# Patient Record
Sex: Female | Born: 1975 | Race: Black or African American | Hispanic: No | Marital: Single | Smoking: Current every day smoker
Health system: Southern US, Community
[De-identification: ages and names within clinical notes are randomized; demographics above are authoritative.]

## PROBLEM LIST (undated history)

## (undated) DIAGNOSIS — D649 Anemia, unspecified: Secondary | ICD-10-CM

## (undated) DIAGNOSIS — F32A Depression, unspecified: Secondary | ICD-10-CM

## (undated) DIAGNOSIS — A599 Trichomoniasis, unspecified: Secondary | ICD-10-CM

## (undated) DIAGNOSIS — F419 Anxiety disorder, unspecified: Secondary | ICD-10-CM

## (undated) DIAGNOSIS — D219 Benign neoplasm of connective and other soft tissue, unspecified: Secondary | ICD-10-CM

## (undated) DIAGNOSIS — I10 Essential (primary) hypertension: Secondary | ICD-10-CM

## (undated) DIAGNOSIS — R87619 Unspecified abnormal cytological findings in specimens from cervix uteri: Secondary | ICD-10-CM

## (undated) DIAGNOSIS — F329 Major depressive disorder, single episode, unspecified: Secondary | ICD-10-CM

## (undated) HISTORY — DX: Essential (primary) hypertension: I10

## (undated) HISTORY — DX: Depression, unspecified: F32.A

## (undated) HISTORY — DX: Benign neoplasm of connective and other soft tissue, unspecified: D21.9

## (undated) HISTORY — DX: Unspecified abnormal cytological findings in specimens from cervix uteri: R87.619

## (undated) HISTORY — DX: Anxiety disorder, unspecified: F41.9

## (undated) HISTORY — DX: Anemia, unspecified: D64.9

## (undated) HISTORY — DX: Major depressive disorder, single episode, unspecified: F32.9

## (undated) HISTORY — PX: COLPOSCOPY: SHX161

---
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
## 1997-06-27 ENCOUNTER — Emergency Department (HOSPITAL_COMMUNITY): Admission: EM | Admit: 1997-06-27 | Discharge: 1997-06-27 | Payer: Self-pay | Admitting: Emergency Medicine

## 1997-10-16 ENCOUNTER — Emergency Department (HOSPITAL_COMMUNITY): Admission: EM | Admit: 1997-10-16 | Discharge: 1997-10-16 | Payer: Self-pay | Admitting: Emergency Medicine

## 1997-10-22 ENCOUNTER — Ambulatory Visit (HOSPITAL_COMMUNITY): Admission: RE | Admit: 1997-10-22 | Discharge: 1997-10-22 | Payer: Self-pay

## 1998-10-09 ENCOUNTER — Emergency Department (HOSPITAL_COMMUNITY): Admission: EM | Admit: 1998-10-09 | Discharge: 1998-10-09 | Payer: Self-pay | Admitting: Emergency Medicine

## 1998-10-09 ENCOUNTER — Encounter: Payer: Self-pay | Admitting: Emergency Medicine

## 1999-06-24 ENCOUNTER — Emergency Department (HOSPITAL_COMMUNITY): Admission: EM | Admit: 1999-06-24 | Discharge: 1999-06-25 | Payer: Self-pay | Admitting: Emergency Medicine

## 1999-07-16 ENCOUNTER — Inpatient Hospital Stay (HOSPITAL_COMMUNITY): Admission: AD | Admit: 1999-07-16 | Discharge: 1999-07-16 | Payer: Self-pay | Admitting: *Deleted

## 2001-12-12 ENCOUNTER — Encounter: Payer: Self-pay | Admitting: Emergency Medicine

## 2001-12-12 ENCOUNTER — Emergency Department (HOSPITAL_COMMUNITY): Admission: AC | Admit: 2001-12-12 | Discharge: 2001-12-12 | Payer: Self-pay

## 2002-10-28 ENCOUNTER — Inpatient Hospital Stay (HOSPITAL_COMMUNITY): Admission: AD | Admit: 2002-10-28 | Discharge: 2002-10-28 | Payer: Self-pay | Admitting: Obstetrics & Gynecology

## 2002-10-30 ENCOUNTER — Inpatient Hospital Stay (HOSPITAL_COMMUNITY): Admission: AD | Admit: 2002-10-30 | Discharge: 2002-10-30 | Payer: Self-pay | Admitting: *Deleted

## 2002-11-09 ENCOUNTER — Ambulatory Visit (HOSPITAL_COMMUNITY): Admission: RE | Admit: 2002-11-09 | Discharge: 2002-11-09 | Payer: Self-pay | Admitting: *Deleted

## 2003-02-04 ENCOUNTER — Ambulatory Visit (HOSPITAL_COMMUNITY): Admission: RE | Admit: 2003-02-04 | Discharge: 2003-02-04 | Payer: Self-pay | Admitting: *Deleted

## 2003-06-12 HISTORY — PX: TUBAL LIGATION: SHX77

## 2003-06-25 ENCOUNTER — Inpatient Hospital Stay (HOSPITAL_COMMUNITY): Admission: AD | Admit: 2003-06-25 | Discharge: 2003-06-25 | Payer: Self-pay | Admitting: *Deleted

## 2003-06-27 ENCOUNTER — Inpatient Hospital Stay (HOSPITAL_COMMUNITY): Admission: AD | Admit: 2003-06-27 | Discharge: 2003-06-27 | Payer: Self-pay | Admitting: Obstetrics & Gynecology

## 2003-06-29 ENCOUNTER — Inpatient Hospital Stay (HOSPITAL_COMMUNITY): Admission: AD | Admit: 2003-06-29 | Discharge: 2003-06-29 | Payer: Self-pay | Admitting: Obstetrics and Gynecology

## 2003-07-01 ENCOUNTER — Inpatient Hospital Stay (HOSPITAL_COMMUNITY): Admission: AD | Admit: 2003-07-01 | Discharge: 2003-07-01 | Payer: Self-pay | Admitting: Obstetrics & Gynecology

## 2003-07-08 ENCOUNTER — Inpatient Hospital Stay (HOSPITAL_COMMUNITY): Admission: AD | Admit: 2003-07-08 | Discharge: 2003-07-11 | Payer: Self-pay | Admitting: Obstetrics and Gynecology

## 2003-07-09 ENCOUNTER — Encounter (INDEPENDENT_AMBULATORY_CARE_PROVIDER_SITE_OTHER): Payer: Self-pay | Admitting: Specialist

## 2003-11-21 ENCOUNTER — Emergency Department (HOSPITAL_COMMUNITY): Admission: EM | Admit: 2003-11-21 | Discharge: 2003-11-21 | Payer: Self-pay | Admitting: Family Medicine

## 2006-02-08 ENCOUNTER — Emergency Department (HOSPITAL_COMMUNITY): Admission: EM | Admit: 2006-02-08 | Discharge: 2006-02-08 | Payer: Self-pay | Admitting: Family Medicine

## 2006-09-02 ENCOUNTER — Ambulatory Visit (HOSPITAL_COMMUNITY): Admission: RE | Admit: 2006-09-02 | Discharge: 2006-09-02 | Payer: Self-pay | Admitting: Obstetrics & Gynecology

## 2006-09-27 ENCOUNTER — Emergency Department (HOSPITAL_COMMUNITY): Admission: EM | Admit: 2006-09-27 | Discharge: 2006-09-27 | Payer: Self-pay | Admitting: Emergency Medicine

## 2007-11-10 ENCOUNTER — Other Ambulatory Visit: Admission: RE | Admit: 2007-11-10 | Discharge: 2007-11-10 | Payer: Self-pay | Admitting: Family Medicine

## 2009-04-16 ENCOUNTER — Ambulatory Visit (HOSPITAL_COMMUNITY): Admission: RE | Admit: 2009-04-16 | Discharge: 2009-04-16 | Payer: Self-pay | Admitting: Obstetrics & Gynecology

## 2010-01-31 ENCOUNTER — Encounter: Payer: Self-pay | Admitting: *Deleted

## 2010-03-29 ENCOUNTER — Emergency Department (HOSPITAL_COMMUNITY): Payer: PRIVATE HEALTH INSURANCE

## 2010-03-29 ENCOUNTER — Emergency Department (HOSPITAL_COMMUNITY)
Admission: EM | Admit: 2010-03-29 | Discharge: 2010-03-30 | Disposition: A | Discharge status: Home / Self Care | Payer: PRIVATE HEALTH INSURANCE | Attending: Emergency Medicine | Admitting: Emergency Medicine

## 2010-03-29 DIAGNOSIS — M545 Low back pain, unspecified: Secondary | ICD-10-CM | POA: Insufficient documentation

## 2010-03-29 DIAGNOSIS — N12 Tubulo-interstitial nephritis, not specified as acute or chronic: Secondary | ICD-10-CM | POA: Insufficient documentation

## 2010-03-29 DIAGNOSIS — D259 Leiomyoma of uterus, unspecified: Secondary | ICD-10-CM | POA: Insufficient documentation

## 2010-03-29 DIAGNOSIS — R109 Unspecified abdominal pain: Secondary | ICD-10-CM | POA: Insufficient documentation

## 2010-03-29 LAB — COMPREHENSIVE METABOLIC PANEL
AST: 12 U/L (ref 0–37)
Albumin: 3.3 g/dL — ABNORMAL LOW (ref 3.5–5.2)
Calcium: 8.7 mg/dL (ref 8.4–10.5)
GFR calc Af Amer: >60 mL/min (ref >60)
Glucose, Bld: 82 mg/dL (ref 70–99)
Potassium: 3.2 mEq/L — ABNORMAL LOW (ref 3.5–5.1)
Total Protein: 6.8 g/dL (ref 6.0–8.3)

## 2010-03-29 LAB — CBC
HCT: 29.3 % — ABNORMAL LOW (ref 36.0–46.0)
Hemoglobin: 9.5 g/dL — ABNORMAL LOW (ref 12.0–15.0)
MCH: 27.4 pg (ref 26.0–34.0)
MCHC: 32.4 g/dL (ref 30.0–36.0)
Platelets: 226 10*3/uL (ref 150–400)
RBC: 3.47 MIL/uL — ABNORMAL LOW (ref 3.87–5.11)
RDW: 15.3 % (ref 11.5–15.5)

## 2010-03-29 LAB — DIFFERENTIAL
Basophils Relative: 0 % (ref 0–1)
Eosinophils Absolute: 0.1 10*3/uL (ref 0.0–0.7)
Lymphs Abs: 2.1 10*3/uL (ref 0.7–4.0)
Monocytes Absolute: 0.5 10*3/uL (ref 0.1–1.0)
Neutro Abs: 4.2 10*3/uL (ref 1.7–7.7)
Neutrophils Relative %: 61 % (ref 43–77)

## 2010-03-29 LAB — URINALYSIS, ROUTINE W REFLEX MICROSCOPIC
Nitrite: NEGATIVE
Protein, ur: NEGATIVE mg/dL
Specific Gravity, Urine: 1.014 (ref 1.005–1.030)

## 2010-03-29 LAB — URINE MICROSCOPIC-ADD ON

## 2010-03-29 LAB — LIPASE, BLOOD: Lipase: 26 U/L (ref 11–59)

## 2010-03-30 ENCOUNTER — Encounter (HOSPITAL_COMMUNITY): Payer: Self-pay

## 2010-03-30 MED ORDER — IOHEXOL 300 MG/ML  SOLN
100.0000 mL | Freq: Once | INTRAMUSCULAR | Status: AC | PRN
  Administered 2010-03-30: 100 mL via INTRAVENOUS

## 2010-03-31 LAB — URINE CULTURE
Colony Count: NO GROWTH
Culture: NO GROWTH

## 2010-05-29 NOTE — Op Note (Signed)
Kara Morales, Kara Morales                       ACCOUNT NO.:  [id number]   MEDICAL RECORD NO.:  [id number]                   PATIENT TYPE:  INP   LOCATION:  9146                                 FACILITY:  [facility]   PHYSICIAN:  Conni Elliot, M.D.             DATE OF BIRTH:  Jun 28, 1975   DATE OF PROCEDURE:  07/09/2003  DATE OF DISCHARGE:                                 OPERATIVE REPORT   PREOPERATIVE DIAGNOSIS:  Desires surgical sterilization.   POSTOPERATIVE DIAGNOSIS:  Desires surgical sterilization.   OPERATION:  Modified bilateral Pomeroy tubal ligation.   SURGEON:  Conni Elliot, M.D.   ANESTHESIA:  Continuous local epidural.   FINDINGS:  Uterus, tubes and ovaries normal for post gravid state.   DESCRIPTION OF PROCEDURE:  After presentation of continuous lumbar epidural  anesthetic, the patient in supine, abdomen is prepped and draped in sterile  fashion.  A periumbilical incision is made.  Incision made through the skin  to the fascia.  __________.  Right fallopian tube is identified, grasped  with Babcock clamp, followed to fimbriated end.  Segment of tube is brought  into the operative field, doubly suture ligated and approximately 0.5 to 2  cm segment was incised.  Hemostasis was adequate.  Procedure done on the  opposite side.  Hemostasis again adequate.  Anterior peritoneum and fascia  as well as skin closed in same fashion.  Estimated blood loss less than 10  mL.  Needle and sponge counts correct.                                               Conni Elliot, M.D.    ASG/MEDQ  D:  07/09/2003  T:  07/09/2003  Job:  [id number]

## 2012-03-24 ENCOUNTER — Encounter (HOSPITAL_COMMUNITY): Payer: Self-pay | Admitting: Emergency Medicine

## 2012-03-24 ENCOUNTER — Emergency Department (HOSPITAL_COMMUNITY)
Admission: EM | Admit: 2012-03-24 | Discharge: 2012-03-24 | Disposition: A | Discharge status: Home / Self Care | Payer: PRIVATE HEALTH INSURANCE | Source: Home / Self Care | Attending: Emergency Medicine | Admitting: Emergency Medicine

## 2012-03-24 DIAGNOSIS — I10 Essential (primary) hypertension: Secondary | ICD-10-CM

## 2012-03-24 LAB — POCT I-STAT, CHEM 8
BUN: 8 mg/dL (ref 6–23)
Calcium, Ion: 1.23 mmol/L (ref 1.12–1.23)
Creatinine, Ser: 0.9 mg/dL (ref 0.50–1.10)
HCT: 37 % (ref 36.0–46.0)
Hemoglobin: 12.6 g/dL (ref 12.0–15.0)
Potassium: 3.6 mEq/L (ref 3.5–5.1)
TCO2: 28 mmol/L (ref 0–100)

## 2012-03-24 MED ORDER — HYDROCHLOROTHIAZIDE 25 MG PO TABS: 25.0000 mg | ORAL_TABLET | Freq: Every day | ORAL | Status: DC

## 2012-03-24 NOTE — ED Notes (Signed)
Notified dr Lorenz Coaster of 2 elevated blood pressure readings

## 2012-03-24 NOTE — ED Provider Notes (Signed)
Chief Complaint:   Chief Complaint  Patient presents with  . Hypertension    History of Present Illness:   Kara Morales is a 37 year old female who became concerned about her blood pressure yesterday. 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 Her blood pressure yesterday 144/110 and 158/118. Today her blood pressure was 140/101 and 160/120. Both her mother and her father have high blood pressure. She denies any headaches, dizziness, blurry vision, chest pain, or shortness of breath. She is a cigarette smoker but does not drink alcohol. She is not careful about salt and sodium intake. She denies any history of diabetes, elevated cholesterol, heart disease, stroke, or kidney disease.  Review of Systems:  Other than noted above, the patient denies any of the following symptoms: Systemic:  No fever, chills, fatigue, weight loss or gain. Respiratory:  No coughing, wheezing, or shortness of breath. Cardiac:  No chest pain, tightness, pressure, palpitations, syncope, or edema. Neuro:  No headache, dizziness, blurred vision, weakness, paresthesias, or strokelike symptoms.  PMFSH:  Past medical history, family history, social history, meds, and allergies were reviewed.  Physical Exam:   Vital signs:  BP 158/102  Pulse 75  Temp(Src) 98.7 F (37.1 C)  Resp 20  SpO2 98%  LMP 03/12/2012 General:  Alert, oriented, in no distress. Lungs:  Breath sounds clear and equal bilaterally.  No wheezes, rales, or rhonchi. Heart:  Regular rhythm, no gallops, murmers, clicks or rubs.  Abdomen:  Soft and flat.  Nontender, no organomegaly or mass.  No pulsatile midline abdominal mass or bruit. Ext:  No edema, pulses full.  Labs:   Results for orders placed during the hospital encounter of 03/24/12  POCT I-STAT, CHEM 8      Result Value Range   Sodium 142  135 - 145 mEq/L   Potassium 3.6  3.5 - 5.1 mEq/L   Chloride 106  96 - 112 mEq/L    BUN 8  6 - 23 mg/dL   Creatinine, Ser 4.09  0.50 - 1.10 mg/dL   Glucose, Bld 88  70 - 99 mg/dL   Calcium, Ion 8.11  9.14 - 1.23 mmol/L   TCO2 28  0 - 100 mmol/L   Hemoglobin 12.6  12.0 - 15.0 g/dL   HCT 78.2  95.6 - 21.3 %    Assessment:  The encounter diagnosis was Hypertension.  She has had elevated blood pressure on 3 separate occasions, therefore she needs to be medicated. She was started on hydrochlorothiazide and I suggested that she followup with a primary care physician within the next 2 weeks. Information was given about salt and sodium intake.  Plan:   1.  The following meds were prescribed:   Discharge Medication List as of 03/24/2012  3:49 PM    START taking these medications   Details  hydrochlorothiazide (HYDRODIURIL) 25 MG tablet Take 1 tablet (25 mg total) by mouth daily., Starting 03/24/2012, Until Discontinued, Normal       2.  The patient was instructed in symptomatic care and handouts were given. 3.  The patient was told to return if becoming worse in any way, if no better in 3 or 4 days, and given some red flag symptoms that would indicate earlier return.    Reuben Likes, MD 03/24/12 [phone]

## 2012-03-24 NOTE — ED Notes (Signed)
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 Patient used an automatic wrist cuff-144/110.  Pressure was manually checked -158/118.  Denies running high blood pressures in the past.  Patient has small headache today, but none out of the ordinary per patient

## 2012-08-17 ENCOUNTER — Ambulatory Visit: Payer: Self-pay | Admitting: Obstetrics & Gynecology

## 2013-01-11 DIAGNOSIS — I10 Essential (primary) hypertension: Secondary | ICD-10-CM

## 2013-01-11 HISTORY — DX: Essential (primary) hypertension: I10

## 2013-01-18 ENCOUNTER — Emergency Department (INDEPENDENT_AMBULATORY_CARE_PROVIDER_SITE_OTHER)
Admission: EM | Admit: 2013-01-18 | Discharge: 2013-01-18 | Disposition: A | Discharge status: Home / Self Care | Payer: PRIVATE HEALTH INSURANCE | Source: Home / Self Care | Attending: Family Medicine | Admitting: Family Medicine

## 2013-01-18 ENCOUNTER — Encounter (HOSPITAL_COMMUNITY): Payer: Self-pay | Admitting: Emergency Medicine

## 2013-01-18 DIAGNOSIS — I10 Essential (primary) hypertension: Secondary | ICD-10-CM

## 2013-01-18 LAB — POCT I-STAT, CHEM 8
BUN: 8 mg/dL (ref 6–23)
CALCIUM ION: 1.23 mmol/L (ref 1.12–1.23)
CHLORIDE: 104 meq/L (ref 96–112)
Creatinine, Ser: 0.7 mg/dL (ref 0.50–1.10)
GLUCOSE: 83 mg/dL (ref 70–99)
HEMATOCRIT: 37 % (ref 36.0–46.0)
Hemoglobin: 12.6 g/dL (ref 12.0–15.0)
Potassium: 3.7 mEq/L (ref 3.7–5.3)
SODIUM: 140 meq/L (ref 137–147)
TCO2: 26 mmol/L (ref 0–100)

## 2013-01-18 MED ORDER — LISINOPRIL-HYDROCHLOROTHIAZIDE 10-12.5 MG PO TABS: 1.0000 | ORAL_TABLET | Freq: Every day | ORAL | Status: DC

## 2013-01-18 NOTE — Discharge Instructions (Signed)
Take medicine daily as prescribed, you must see a doctor in 3-4 weeks for recheck and further refills.

## 2013-01-18 NOTE — ED Provider Notes (Signed)
CSN: [id number]     Arrival date & time 01/18/13  1629 History   First MD Initiated Contact with Patient 01/18/13 1849     No chief complaint on file.  (Consider location/radiation/quality/duration/timing/severity/associated sxs/prior Treatment) Patient is a 38 y.o. female presenting with hypertension. The history is provided by the patient.  Hypertension This is a chronic problem. The current episode started 6 to 12 hours ago (seen by dr Jake Michaelis 3/14 and given meds for hbp, took all and did no t refill or no lmd f/u, here today b/o bp found to be elevated.at work.). The problem has not changed since onset.Associated symptoms include headaches. Pertinent negatives include no chest pain and no shortness of breath.    No past medical history on file. Past Surgical History  Procedure Laterality Date  . Tubal ligation     Family History  Problem Relation Age of Onset  . Hypertension Mother   . Hypertension Father   . Diabetes Other    History  Substance Use Topics  . Smoking status: Current Every Day Smoker    Types: Cigars  . Smokeless tobacco: Not on file  . Alcohol Use: Yes   OB History   Grav Para Term Preterm Abortions TAB SAB Ect Mult Living                 Review of Systems  Constitutional: Negative.   Respiratory: Negative for chest tightness and shortness of breath.   Cardiovascular: Negative for chest pain and leg swelling.  Neurological: Positive for headaches. Negative for dizziness, weakness, light-headedness and numbness.    Allergies  Review of patient's allergies indicates no known allergies.  Home Medications   Current Outpatient Rx  Name  Route  Sig  Dispense  Refill  . Biotin 10 MG CAPS   Oral   Take by mouth.         . hydrochlorothiazide (HYDRODIURIL) 25 MG tablet   Oral   Take 1 tablet (25 mg total) by mouth daily.   90 tablet   0   . lisinopril-hydrochlorothiazide (PRINZIDE,ZESTORETIC) 10-12.5 MG per tablet   Oral   Take 1 tablet by mouth  daily.   30 tablet   1   . Multiple Vitamin (MULTIVITAMIN) tablet   Oral   Take 1 tablet by mouth daily.          BP 135/96  Pulse 80  Temp(Src) 98.5 F (36.9 C) (Oral)  Resp 16  SpO2 100% Physical Exam  Nursing note and vitals reviewed. Constitutional: She is oriented to person, place, and time. She appears well-developed and well-nourished. No distress.  Eyes: Conjunctivae and EOM are normal. Pupils are equal, round, and reactive to light.  Neck: Normal range of motion. Neck supple.  Cardiovascular: Regular rhythm, normal heart sounds and intact distal pulses.   Pulmonary/Chest: Breath sounds normal.  Musculoskeletal: She exhibits no edema.  Lymphadenopathy:    She has no cervical adenopathy.  Neurological: She is alert and oriented to person, place, and time.  Skin: Skin is warm and dry.    ED Course  Procedures (including critical care time) Labs Review Labs Reviewed  POCT I-STAT, CHEM 8   Imaging Review No results found.  EKG Interpretation    Date/Time:    Ventricular Rate:    PR Interval:    QRS Duration:   QT Interval:    QTC Calculation:   R Axis:     Text Interpretation:  MDM  i-stat wnl.    Billy Fischer, MD 01/18/13 Curly [place]

## 2013-01-18 NOTE — ED Notes (Signed)
[facility] and not feeling well, bp 140/80 per patient.  Reports at work readings of 152/120??.  Currently does not feel any chest pain, does not feel particularly bad.

## 2013-06-21 ENCOUNTER — Ambulatory Visit: Payer: PRIVATE HEALTH INSURANCE | Admitting: Obstetrics & Gynecology

## 2013-08-09 DIAGNOSIS — I10 Essential (primary) hypertension: Secondary | ICD-10-CM | POA: Insufficient documentation

## 2013-08-21 ENCOUNTER — Encounter: Payer: Self-pay | Admitting: *Deleted

## 2013-09-05 ENCOUNTER — Ambulatory Visit: Payer: PRIVATE HEALTH INSURANCE | Admitting: Obstetrics & Gynecology

## 2013-09-06 ENCOUNTER — Encounter: Payer: Self-pay | Admitting: Obstetrics & Gynecology

## 2013-09-06 ENCOUNTER — Ambulatory Visit (INDEPENDENT_AMBULATORY_CARE_PROVIDER_SITE_OTHER): Payer: PRIVATE HEALTH INSURANCE | Admitting: Obstetrics & Gynecology

## 2013-09-06 VITALS — BP 108/84 | Temp 98.5°F | Ht 67.5 in | Wt 183.0 lb

## 2013-09-06 DIAGNOSIS — Z01419 Encounter for gynecological examination (general) (routine) without abnormal findings: Secondary | ICD-10-CM

## 2013-09-06 DIAGNOSIS — D259 Leiomyoma of uterus, unspecified: Secondary | ICD-10-CM

## 2013-09-06 LAB — CBC
HCT: 34.4 % — ABNORMAL LOW (ref 36.0–46.0)
Hemoglobin: 11.5 g/dL — ABNORMAL LOW (ref 12.0–15.0)
MCH: 27.3 pg (ref 26.0–34.0)
MCHC: 33.4 g/dL (ref 30.0–36.0)
MCV: 81.5 fL (ref 78.0–100.0)
PLATELETS: 271 10*3/uL (ref 150–400)
RBC: 4.22 MIL/uL (ref 3.87–5.11)
RDW: 17.8 % — ABNORMAL HIGH (ref 11.5–15.5)
WBC: 6.2 10*3/uL (ref 4.0–10.5)

## 2013-09-06 NOTE — Progress Notes (Signed)
Subjective:     Kara Morales is a 38 y.o. female here for a routine exam.  Current complaints: none.    Personal health questionnaire:  Is patient Ashkenazi Jewish, have a family history of breast and/or ovarian cancer: no Is there a family history of uterine cancer diagnosed at age < 33, gastrointestinal cancer, urinary tract cancer, family member who is a Field seismologist syndrome-associated carrier: no Is the patient overweight and hypertensive, family history of diabetes, personal history of gestational diabetes or PCOS: yes Is patient over 8, have PCOS,  family history of premature CHD under age 38, diabetes, smoke, have hypertension or peripheral artery disease:  yes At any time, has a partner hit, kicked or otherwise hurt or frightened you?: no Over the past 2 weeks, have you felt down, depressed or hopeless?: no Over the past 2 weeks, have you felt little interest or pleasure in doing things?:no   Gynecologic History Patient's last menstrual period was 08/13/2013. Contraception: tubal ligation Last Pap: 2011. Results were: normal   Obstetric History OB History  Gravida Para Term Preterm AB SAB TAB Ectopic Multiple Living  0 0 0 0 0 0 0 0 0 0         History reviewed. No pertinent past medical history.  Past Surgical History  Procedure Laterality Date  . Tubal ligation      Current outpatient prescriptions:azithromycin (ZITHROMAX) 250 MG tablet, Take 250 mg by mouth daily., Disp: , Rfl: ;  Biotin 10 MG CAPS, Take by mouth., Disp: , Rfl: ;  lisinopril-hydrochlorothiazide (PRINZIDE,ZESTORETIC) 10-12.5 MG per tablet, Take 1 tablet by mouth daily., Disp: 30 tablet, Rfl: 1;  Multiple Vitamin (MULTIVITAMIN) tablet, Take 1 tablet by mouth daily., Disp: , Rfl:  No Known Allergies  History  Substance Use Topics  . Smoking status: Current Every Day Smoker    Types: Cigars  . Smokeless tobacco: Not on file  . Alcohol Use: Yes    Family History  Problem Relation Age of Onset  .  Hypertension Mother   . Hypertension Father   . Diabetes Other       Review of Systems  Constitutional: negative for fatigue and weight loss Respiratory: negative for cough and wheezing Cardiovascular: negative for chest pain, fatigue and palpitations Gastrointestinal: negative for abdominal pain and change in bowel habits Musculoskeletal:negative for myalgias Neurological: negative for gait problems and tremors Behavioral/Psych: negative for abusive relationship, depression Endocrine: negative for temperature intolerance   Genitourinary:negative for abnormal menstrual periods, genital lesions, hot flashes, sexual problems and vaginal discharge Integument/breast: negative for breast lump, breast tenderness, nipple discharge and skin lesion(s)    Objective:       BP 108/84  Temp(Src) 98.5 F (36.9 C)  Ht 5' 7.5" (1.715 m)  Wt 83.008 kg (183 lb)  BMI 28.22 kg/m2  LMP 08/13/2013 General:   alert  Skin:   no rash or abnormalities  Lungs:   clear to auscultation bilaterally  Heart:   regular rate and rhythm, S1, S2 normal, no murmur, click, rub or gallop  Breasts:   normal without suspicious masses, skin or nipple changes or axillary nodes  Abdomen:  normal findings: no organomegaly, soft, non-tender and no hernia  Pelvis:  External genitalia: normal general appearance Urinary system: urethral meatus normal and bladder without fullness, nontender Vaginal: normal without tenderness, induration or masses Cervix: normal appearance Adnexa: normal bimanual exam Uterus: anteverted and non-tender, globular, 12 wks size in aggregate   Lab Review  Labs reviewed yes Radiologic studies reviewed  yes    Assessment:    Healthy female exam.  AUB--L, ?h/o an endometrial polyp on U/S   Plan:    Education reviewed: calcium supplements, low fat, low cholesterol diet, smoking cessation and weight bearing exercise.  Pelvic U/S/CBC Need to obtain previous records Possible management  options include: may need sonoHSG or D&C/hysteroscopy Follow up as needed.

## 2013-09-06 NOTE — Patient Instructions (Signed)

## 2013-09-06 NOTE — Addendum Note (Signed)
Addended by: Valli Glance F on: 09/06/2013 11:09 AM   Modules accepted: Orders

## 2013-09-12 LAB — PAP IG, CT-NG NAA, HPV HIGH-RISK
Chlamydia Probe Amp: NEGATIVE
GC Probe Amp: NEGATIVE
HPV DNA High Risk: NOT DETECTED

## 2013-09-13 ENCOUNTER — Ambulatory Visit (HOSPITAL_COMMUNITY)
Admission: RE | Admit: 2013-09-13 | Discharge: 2013-09-13 | Disposition: A | Discharge status: Home / Self Care | Payer: Managed Care, Other (non HMO) | Source: Ambulatory Visit | Attending: Obstetrics & Gynecology | Admitting: Obstetrics & Gynecology

## 2013-09-13 DIAGNOSIS — N946 Dysmenorrhea, unspecified: Secondary | ICD-10-CM | POA: Insufficient documentation

## 2013-09-13 DIAGNOSIS — D252 Subserosal leiomyoma of uterus: Secondary | ICD-10-CM | POA: Diagnosis not present

## 2013-09-13 DIAGNOSIS — N852 Hypertrophy of uterus: Secondary | ICD-10-CM | POA: Diagnosis not present

## 2013-09-13 DIAGNOSIS — N920 Excessive and frequent menstruation with regular cycle: Secondary | ICD-10-CM | POA: Insufficient documentation

## 2013-09-13 DIAGNOSIS — D251 Intramural leiomyoma of uterus: Secondary | ICD-10-CM | POA: Diagnosis not present

## 2013-09-13 DIAGNOSIS — D259 Leiomyoma of uterus, unspecified: Secondary | ICD-10-CM

## 2014-01-07 ENCOUNTER — Encounter: Payer: Self-pay | Admitting: *Deleted

## 2014-01-08 ENCOUNTER — Encounter: Payer: Self-pay | Admitting: Obstetrics & Gynecology

## 2016-07-22 DIAGNOSIS — Z720 Tobacco use: Secondary | ICD-10-CM | POA: Diagnosis not present

## 2016-07-22 DIAGNOSIS — I10 Essential (primary) hypertension: Secondary | ICD-10-CM | POA: Diagnosis not present

## 2017-03-25 ENCOUNTER — Ambulatory Visit: Payer: BLUE CROSS/BLUE SHIELD | Admitting: Podiatry

## 2017-03-25 ENCOUNTER — Encounter: Payer: Self-pay | Admitting: Podiatry

## 2017-03-25 DIAGNOSIS — L600 Ingrowing nail: Secondary | ICD-10-CM

## 2017-03-25 DIAGNOSIS — M79676 Pain in unspecified toe(s): Secondary | ICD-10-CM

## 2017-03-25 NOTE — Progress Notes (Signed)
   Subjective:    Patient ID: Kara Morales, female    DOB: 11-20-1975, 42 y.o.   MRN: [id number]  HPI   Chief Complaint  Patient presents with  . Nail Problem    i have an ingrown toenail on my left         Review of Systems  All other systems reviewed and are negative.      Objective:   Physical Exam        Assessment & Plan:

## 2017-03-25 NOTE — Patient Instructions (Signed)

## 2017-04-08 ENCOUNTER — Ambulatory Visit: Payer: BLUE CROSS/BLUE SHIELD | Admitting: Podiatry

## 2017-04-09 NOTE — Progress Notes (Signed)
  Subjective:  Patient ID: Kara Morales, female    DOB: [birth year],  MRN: [id number]  Chief Complaint  Patient presents with  . Nail Problem    i have an ingrown toenail on my left     42 y.o. female presents with and ingrown toenail to the left great toe. Present for 3 weeks.  Has tried soaking.  Has not been draining.  Denies nausea vomiting fever chills.  Reports soreness and throbbing burning and tingling.   History reviewed. No pertinent past medical history. Past Surgical History:  Procedure Laterality Date  . TUBAL LIGATION      Current Outpatient Medications:  .  azithromycin (ZITHROMAX) 250 MG tablet, Take 250 mg by mouth daily., Disp: , Rfl:  .  Biotin 10 MG CAPS, Take by mouth., Disp: , Rfl:  .  lisinopril-hydrochlorothiazide (PRINZIDE,ZESTORETIC) 10-12.5 MG per tablet, Take 1 tablet by mouth daily., Disp: 30 tablet, Rfl: 1 .  Multiple Vitamin (MULTIVITAMIN) tablet, Take 1 tablet by mouth daily., Disp: , Rfl:   No Known Allergies Review of Systems Objective:  There were no vitals filed for this visit. General AA&O x3. Normal mood and affect.  Vascular Dorsalis pedis and posterior tibial pulses  present 2+ bilaterally  Capillary refill normal to all digits. Pedal hair growth normal.  Neurologic Epicritic sensation grossly present.  Dermatologic No open lesions. Interspaces clear of maceration. Nails well groomed and normal in appearance. Painful ingrowing nail at the hallux nail left.  Orthopedic: MMT 5/5 in dorsiflexion, plantarflexion, inversion, and eversion. Normal joint ROM without pain or crepitus. Pain to palpation about the ingrown nail.   Assessment & Plan:  Patient was evaluated and treated and all questions answered.  Ingrown Nail, left -Patient elects to proceed with ingrown toenail removal today -Ingrown nail excised. See procedure note. -Educated on post-procedure care including soaking. Written instructions provided. -Patient to follow up in  2 weeks for nail check.  Procedure: Excision of Ingrown Toenail Location: Left hallux Anesthesia: Lidocaine 1% plain; 1.5 mL and Marcaine 0.5% plain; 1.5 mL, digital block. Skin Prep: Betadine. Dressing: Silvadene; telfa; dry, sterile, compression dressing. Technique: Following skin prep, the toe was exsanguinated and a tourniquet was secured at the base of the toe. The affected nail border was freed, split with a nail splitter, and excised. Chemical matrixectomy was then performed with phenol and irrigated out with alcohol. The tourniquet was then removed and sterile dressing applied. Disposition: Patient tolerated procedure well. Patient to return in 2 weeks for follow-up.   Return in about 2 weeks (around 04/08/2017).

## 2017-05-10 DIAGNOSIS — D259 Leiomyoma of uterus, unspecified: Secondary | ICD-10-CM | POA: Diagnosis not present

## 2017-05-10 DIAGNOSIS — Z23 Encounter for immunization: Secondary | ICD-10-CM | POA: Diagnosis not present

## 2017-05-10 DIAGNOSIS — Z124 Encounter for screening for malignant neoplasm of cervix: Secondary | ICD-10-CM | POA: Diagnosis not present

## 2017-05-10 DIAGNOSIS — Z13220 Encounter for screening for lipoid disorders: Secondary | ICD-10-CM | POA: Diagnosis not present

## 2017-05-10 DIAGNOSIS — E781 Pure hyperglyceridemia: Secondary | ICD-10-CM | POA: Insufficient documentation

## 2017-05-10 DIAGNOSIS — E782 Mixed hyperlipidemia: Secondary | ICD-10-CM | POA: Insufficient documentation

## 2017-05-10 DIAGNOSIS — I10 Essential (primary) hypertension: Secondary | ICD-10-CM | POA: Diagnosis not present

## 2017-05-10 DIAGNOSIS — F321 Major depressive disorder, single episode, moderate: Secondary | ICD-10-CM | POA: Diagnosis not present

## 2017-05-10 DIAGNOSIS — D500 Iron deficiency anemia secondary to blood loss (chronic): Secondary | ICD-10-CM | POA: Insufficient documentation

## 2017-05-10 DIAGNOSIS — Z0001 Encounter for general adult medical examination with abnormal findings: Secondary | ICD-10-CM | POA: Diagnosis not present

## 2017-06-01 DIAGNOSIS — D3122 Benign neoplasm of left retina: Secondary | ICD-10-CM | POA: Diagnosis not present

## 2017-06-16 DIAGNOSIS — D259 Leiomyoma of uterus, unspecified: Secondary | ICD-10-CM | POA: Diagnosis not present

## 2017-06-16 DIAGNOSIS — I10 Essential (primary) hypertension: Secondary | ICD-10-CM | POA: Diagnosis not present

## 2017-06-16 DIAGNOSIS — Z124 Encounter for screening for malignant neoplasm of cervix: Secondary | ICD-10-CM | POA: Diagnosis not present

## 2017-06-16 DIAGNOSIS — Z01419 Encounter for gynecological examination (general) (routine) without abnormal findings: Secondary | ICD-10-CM | POA: Diagnosis not present

## 2017-06-16 DIAGNOSIS — F321 Major depressive disorder, single episode, moderate: Secondary | ICD-10-CM | POA: Diagnosis not present

## 2017-06-16 DIAGNOSIS — N6320 Unspecified lump in the left breast, unspecified quadrant: Secondary | ICD-10-CM | POA: Diagnosis not present

## 2017-07-18 DIAGNOSIS — Z1231 Encounter for screening mammogram for malignant neoplasm of breast: Secondary | ICD-10-CM | POA: Diagnosis not present

## 2017-07-18 DIAGNOSIS — E6609 Other obesity due to excess calories: Secondary | ICD-10-CM | POA: Insufficient documentation

## 2017-07-18 DIAGNOSIS — Z716 Tobacco abuse counseling: Secondary | ICD-10-CM | POA: Diagnosis not present

## 2017-07-18 DIAGNOSIS — Z6833 Body mass index (BMI) 33.0-33.9, adult: Secondary | ICD-10-CM | POA: Diagnosis not present

## 2017-07-18 DIAGNOSIS — I10 Essential (primary) hypertension: Secondary | ICD-10-CM | POA: Diagnosis not present

## 2017-10-01 ENCOUNTER — Emergency Department (HOSPITAL_COMMUNITY): Payer: BLUE CROSS/BLUE SHIELD

## 2017-10-01 ENCOUNTER — Encounter (HOSPITAL_COMMUNITY): Payer: Self-pay | Admitting: Emergency Medicine

## 2017-10-01 ENCOUNTER — Emergency Department (HOSPITAL_COMMUNITY)
Admission: EM | Admit: 2017-10-01 | Discharge: 2017-10-01 | Disposition: A | Discharge status: Home / Self Care | Payer: BLUE CROSS/BLUE SHIELD | Attending: Emergency Medicine | Admitting: Emergency Medicine

## 2017-10-01 ENCOUNTER — Other Ambulatory Visit: Payer: Self-pay

## 2017-10-01 DIAGNOSIS — N390 Urinary tract infection, site not specified: Secondary | ICD-10-CM | POA: Diagnosis not present

## 2017-10-01 DIAGNOSIS — R109 Unspecified abdominal pain: Secondary | ICD-10-CM

## 2017-10-01 DIAGNOSIS — F17290 Nicotine dependence, other tobacco product, uncomplicated: Secondary | ICD-10-CM | POA: Insufficient documentation

## 2017-10-01 DIAGNOSIS — Z79899 Other long term (current) drug therapy: Secondary | ICD-10-CM | POA: Diagnosis not present

## 2017-10-01 DIAGNOSIS — R1031 Right lower quadrant pain: Secondary | ICD-10-CM | POA: Insufficient documentation

## 2017-10-01 DIAGNOSIS — R188 Other ascites: Secondary | ICD-10-CM | POA: Diagnosis not present

## 2017-10-01 LAB — BASIC METABOLIC PANEL
Anion gap: 10 (ref 5–15)
BUN: 6 mg/dL (ref 6–20)
CO2: 24 mmol/L (ref 22–32)
CREATININE: 0.53 mg/dL (ref 0.44–1.00)
Calcium: 9 mg/dL (ref 8.9–10.3)
Chloride: 104 mmol/L (ref 98–111)
GFR calc Af Amer: >60 mL/min (ref >60)
GLUCOSE: 95 mg/dL (ref 70–99)
Potassium: 3.6 mmol/L (ref 3.5–5.1)
SODIUM: 138 mmol/L (ref 135–145)

## 2017-10-01 LAB — URINALYSIS, ROUTINE W REFLEX MICROSCOPIC
BACTERIA UA: NONE SEEN
Bilirubin Urine: NEGATIVE
Glucose, UA: NEGATIVE mg/dL
Ketones, ur: NEGATIVE mg/dL
LEUKOCYTES UA: NEGATIVE
Nitrite: POSITIVE — AB
PROTEIN: NEGATIVE mg/dL
Specific Gravity, Urine: 1.004 — ABNORMAL LOW (ref 1.005–1.030)
pH: 6 (ref 5.0–8.0)

## 2017-10-01 LAB — CBC WITH DIFFERENTIAL/PLATELET
BASOS ABS: 0 10*3/uL (ref 0.0–0.1)
Basophils Relative: 0 %
EOS ABS: 0 10*3/uL (ref 0.0–0.7)
EOS PCT: 0 %
HCT: 32.7 % — ABNORMAL LOW (ref 36.0–46.0)
Hemoglobin: 11.1 g/dL — ABNORMAL LOW (ref 12.0–15.0)
LYMPHS PCT: 16 %
Lymphs Abs: 1.6 10*3/uL (ref 0.7–4.0)
MCH: 29.5 pg (ref 26.0–34.0)
MCHC: 33.9 g/dL (ref 30.0–36.0)
MCV: 87 fL (ref 78.0–100.0)
MONO ABS: 0.8 10*3/uL (ref 0.1–1.0)
Monocytes Relative: 8 %
Neutro Abs: 7.4 10*3/uL (ref 1.7–7.7)
Neutrophils Relative %: 76 %
Platelets: 294 10*3/uL (ref 150–400)
RBC: 3.76 MIL/uL — AB (ref 3.87–5.11)
RDW: 15.6 % — AB (ref 11.5–15.5)
WBC: 9.8 10*3/uL (ref 4.0–10.5)

## 2017-10-01 LAB — LIPASE, BLOOD: Lipase: 19 U/L (ref 11–51)

## 2017-10-01 LAB — PREGNANCY, URINE: Preg Test, Ur: NEGATIVE

## 2017-10-01 LAB — HEPATIC FUNCTION PANEL
ALT: 15 U/L (ref 0–44)
AST: 20 U/L (ref 15–41)
Albumin: 3.5 g/dL (ref 3.5–5.0)
Alkaline Phosphatase: 62 U/L (ref 38–126)
Bilirubin, Direct: 0.1 mg/dL (ref 0.0–0.2)
Indirect Bilirubin: 0.5 mg/dL (ref 0.3–0.9)
Total Bilirubin: 0.6 mg/dL (ref 0.3–1.2)
Total Protein: 6.9 g/dL (ref 6.5–8.1)

## 2017-10-01 MED ORDER — ONDANSETRON HCL 4 MG PO TABS: 4.0000 mg | ORAL_TABLET | Freq: Four times a day (QID) | ORAL | 0 refills | Status: DC

## 2017-10-01 MED ORDER — IOPAMIDOL (ISOVUE-300) INJECTION 61%
100.0000 mL | Freq: Once | INTRAVENOUS | Status: AC | PRN
  Administered 2017-10-01: 100 mL via INTRAVENOUS

## 2017-10-01 MED ORDER — MORPHINE SULFATE (PF) 4 MG/ML IV SOLN
4.0000 mg | Freq: Once | INTRAVENOUS | Status: AC
  Administered 2017-10-01: 4 mg via INTRAVENOUS
  Filled 2017-10-01: qty 1

## 2017-10-01 MED ORDER — HYDROCODONE-ACETAMINOPHEN 5-325 MG PO TABS: 1.0000 | ORAL_TABLET | Freq: Four times a day (QID) | ORAL | 0 refills | Status: DC | PRN

## 2017-10-01 MED ORDER — CEPHALEXIN 500 MG PO CAPS: 500.0000 mg | ORAL_CAPSULE | Freq: Four times a day (QID) | ORAL | 0 refills | Status: AC

## 2017-10-01 MED ORDER — IOPAMIDOL (ISOVUE-300) INJECTION 61%
INTRAVENOUS | Status: AC
  Filled 2017-10-01: qty 100

## 2017-10-01 MED ORDER — IBUPROFEN 600 MG PO TABS: 600.0000 mg | ORAL_TABLET | Freq: Four times a day (QID) | ORAL | 0 refills | Status: DC | PRN

## 2017-10-01 MED ORDER — KETOROLAC TROMETHAMINE 30 MG/ML IJ SOLN
30.0000 mg | Freq: Once | INTRAMUSCULAR | Status: AC
  Administered 2017-10-01: 30 mg via INTRAVENOUS
  Filled 2017-10-01: qty 1

## 2017-10-01 NOTE — Discharge Instructions (Addendum)
Medications: Keflex, ibuprofen, Norco, Zofran  Treatment: Take Keflex until completed.  A urine culture will be sent and you will be called if there needs to be to change in antibiotic.  Take ibuprofen every 6 hours as needed for your pain.  For breakthrough pain, take 1-2 Norco every 6 hours.  Take Zofran every 6 hours as needed for nausea or vomiting.  Make sure to drink plenty of fluids.   Do not drink alcohol, drive, operate machinery or participate in any other potentially dangerous activities while taking opiate pain medication as it may make you sleepy. Do not take this medication with any other sedating medications, either prescription or over-the-counter. If you were prescribed Percocet or Vicodin, do not take these with acetaminophen (Tylenol) as it is already contained within these medications and overdose of Tylenol is dangerous.   This medication is an opiate (or narcotic) pain medication and can be habit forming.  Use it as little as possible to achieve adequate pain control.  Do not use or use it with extreme caution if you have a history of opiate abuse or dependence. This medication is intended for your use only - do not give any to anyone else and keep it in a secure place where nobody else, especially children, have access to it. It will also cause or worsen constipation, so you may want to consider taking an over-the-counter stool softener while you are taking this medication.  Follow-up: Please follow-up with your doctor if your symptoms are not improving.  I also recommend follow-up with OB/GYN below for further management of your fibroid.  It was found to be 10.5 cm today.

## 2017-10-01 NOTE — ED Triage Notes (Signed)
Pt presents with right flank pain. Patient states history of pyelonephritis, this feels the same. Patient denies urinary symptoms, nausea or vomiting.

## 2017-10-01 NOTE — ED Notes (Signed)
Patient back from CT.

## 2017-10-01 NOTE — ED Provider Notes (Signed)
[facility] DEPT Provider Note   CSN: [id number] Arrival date & time: 10/01/17  0439     History   Chief Complaint Chief Complaint  Patient presents with  . Flank Pain    HPI Kara Morales is a 42 y.o. female with history of hypertension, leiomyoma, pyelonephritis who presents with right lower quadrant and right flank pain that began 2 days ago.  Patient's pain has been constant since then.  She reports it feels like the kidney infection she had in the past.  She has no history of kidney stones.  She has only had a tubal ligation in the past.  She denies any fevers, chest pain, shortness of breath, nausea, vomiting, diarrhea, bloody stools, urinary symptoms.  Patient has been taking Azo at home without relief.  HPI  History reviewed. No pertinent past medical history.  Patient Active Problem List   Diagnosis Date Noted  . Leiomyoma of uterus, unspecified 09/06/2013  . BP (high blood pressure) 08/09/2013    Past Surgical History:  Procedure Laterality Date  . TUBAL LIGATION       OB History    Gravida  0   Para  0   Term  0   Preterm  0   AB  0   Living  0     SAB  0   TAB  0   Ectopic  0   Multiple  0   Live Births               Home Medications    Prior to Admission medications   Medication Sig Start Date End Date Taking? Authorizing Provider  lisinopril-hydrochlorothiazide (PRINZIDE,ZESTORETIC) 10-12.5 MG per tablet Take 1 tablet by mouth daily. 01/18/13  Yes Kindl, Nelda Severe, MD  Multiple Vitamin (MULTIVITAMIN) tablet Take 1 tablet by mouth daily.   Yes [provider]  cephALEXin (KEFLEX) 500 MG capsule Take 1 capsule (500 mg total) by mouth 4 (four) times daily for 10 days. 10/01/17 10/11/17  Frederica Kuster, PA-C  HYDROcodone-acetaminophen (NORCO/VICODIN) 5-325 MG tablet Take 1-2 tablets by mouth every 6 (six) hours as needed for severe pain. 10/01/17   Malvern Kadlec, Bea Graff, PA-C  ibuprofen (ADVIL,MOTRIN)  600 MG tablet Take 1 tablet (600 mg total) by mouth every 6 (six) hours as needed. 10/01/17   Chamaine Stankus, Bea Graff, PA-C  ondansetron (ZOFRAN) 4 MG tablet Take 1 tablet (4 mg total) by mouth every 6 (six) hours. 10/01/17   Frederica Kuster, PA-C    Family History Family History  Problem Relation Age of Onset  . Hypertension Mother   . Hypertension Father   . Diabetes Other     Social History Social History   Tobacco Use  . Smoking status: Current Every Day Smoker    Types: Cigars  . Smokeless tobacco: Never Used  Substance Use Topics  . Alcohol use: Yes  . Drug use: No     Allergies   Patient has no known allergies.   Review of Systems Review of Systems  Constitutional: Negative for chills and fever.  HENT: Negative for facial swelling and sore throat.   Respiratory: Negative for shortness of breath.   Cardiovascular: Negative for chest pain.  Gastrointestinal: Positive for abdominal pain. Negative for nausea and vomiting.  Genitourinary: Positive for flank pain. Negative for dysuria.  Musculoskeletal: Positive for back pain.  Skin: Negative for rash and wound.  Neurological: Negative for headaches.  Psychiatric/Behavioral: The patient is not nervous/anxious.  Physical Exam Updated Vital Signs BP (!) 130/92   Pulse 77   Temp 98.4 F (36.9 C) (Oral)   Resp 18   SpO2 99%   Physical Exam  Constitutional: She appears well-developed and well-nourished. No distress.  HENT:  Head: Normocephalic and atraumatic.  Mouth/Throat: Oropharynx is clear and moist. No oropharyngeal exudate.  Eyes: Pupils are equal, round, and reactive to light. Conjunctivae are normal. Right eye exhibits no discharge. Left eye exhibits no discharge. No scleral icterus.  Neck: Normal range of motion. Neck supple. No thyromegaly present.  Cardiovascular: Normal rate, regular rhythm, normal heart sounds and intact distal pulses. Exam reveals no gallop and no friction rub.  No murmur  heard. Pulmonary/Chest: Effort normal and breath sounds normal. No stridor. No respiratory distress. She has no wheezes. She has no rales.  Abdominal: Soft. Bowel sounds are normal. She exhibits no distension. There is tenderness in the right lower quadrant. There is tenderness at McBurney's point. There is no rebound, no guarding and no CVA tenderness.    + Rovsing's  Musculoskeletal: She exhibits no edema.  Lymphadenopathy:    She has no cervical adenopathy.  Neurological: She is alert. Coordination normal.  Skin: Skin is warm and dry. No rash noted. She is not diaphoretic. No pallor.  Psychiatric: She has a normal mood and affect.  Nursing note and vitals reviewed.    ED Treatments / Results  Labs (all labs ordered are listed, but only abnormal results are displayed) Labs Reviewed  URINALYSIS, ROUTINE W REFLEX MICROSCOPIC - Abnormal; Notable for the following components:      Result Value   Color, Urine AMBER (*)    Specific Gravity, Urine 1.004 (*)    Hgb urine dipstick SMALL (*)    Nitrite POSITIVE (*)    All other components within normal limits  CBC WITH DIFFERENTIAL/PLATELET - Abnormal; Notable for the following components:   RBC 3.76 (*)    Hemoglobin 11.1 (*)    HCT 32.7 (*)    RDW 15.6 (*)    All other components within normal limits  URINE CULTURE  BASIC METABOLIC PANEL  LIPASE, BLOOD  PREGNANCY, URINE  HEPATIC FUNCTION PANEL    EKG None  Radiology Ct Abdomen Pelvis W Contrast  Result Date: 10/01/2017 CLINICAL DATA:  Right lower quadrant abdominal pain, history of pyelonephritis EXAM: CT ABDOMEN AND PELVIS WITH CONTRAST TECHNIQUE: Multidetector CT imaging of the abdomen and pelvis was performed using the standard protocol following bolus administration of intravenous contrast. CONTRAST:  115m ISOVUE-300 IOPAMIDOL (ISOVUE-300) INJECTION 61% COMPARISON:  03/30/2010 FINDINGS: Lower chest: Lung bases are clear. Hepatobiliary: Liver is within normal limits.  Gallbladder is unremarkable. No intrahepatic or extrahepatic ductal dilatation. Pancreas: Within normal limits. Spleen: Within normal limits. Adrenals/Urinary Tract: Adrenal glands are within normal limits. Kidneys are within normal limits.  No hydronephrosis. Bladder is within normal limits. Stomach/Bowel: Stomach is within normal limits. No evidence of bowel obstruction. Normal appendix (series 2/image 70). Vascular/Lymphatic: No evidence of abdominal aortic aneurysm. Atherosclerotic calcifications of the abdominal aorta and branch vessels. No suspicious abdominopelvic lymphadenopathy. Reproductive: Uterine fibroids, including a 8.9 x 8.1 x 10.5 cm necrotic/degenerating subserosal fibroid in the right posterior uterine fundus (series 2/image 68). Bilateral ovaries are within normal limits, noting a left corpus luteal cyst/follicle (series 2/image 33), physiologic. Other: Small volume pelvic ascites. Musculoskeletal: Visualized osseous structures are within normal limits. IMPRESSION: Uterine fibroids, including a 10.5 cm necrotic/degenerating subserosal fibroid in the right posterior uterine fundus. This can be a  source of pain. No evidence of bowel obstruction.  Normal appendix. No CT findings to suggest pyelonephritis. Small volume pelvic ascites, likely reactive/physiologic. Electronically Signed   By: Julian Hy M.D.   On: 10/01/2017 09:27    Procedures Procedures (including critical care time)  Medications Ordered in ED Medications  iopamidol (ISOVUE-300) 61 % injection (has no administration in time range)  morphine 4 MG/ML injection 4 mg (4 mg Intravenous Given 10/01/17 0631)  iopamidol (ISOVUE-300) 61 % injection 100 mL (100 mLs Intravenous Contrast Given 10/01/17 0840)  morphine 4 MG/ML injection 4 mg (4 mg Intravenous Given 10/01/17 0902)  ketorolac (TORADOL) 30 MG/ML injection 30 mg (30 mg Intravenous Given 10/01/17 1014)     Initial Impression / Assessment and Plan / ED Course  I  have reviewed the triage vital signs and the nursing notes.  Pertinent labs & imaging results that were available during my care of the patient were reviewed by me and considered in my medical decision making (see chart for details).  Clinical Course as of Oct 01 1028  Sat Oct 01, 2017  0901 Patient complaining of return of pain after returning from CT scan, but will re-dose morphine 4 mg at this time.   [AL]    Clinical Course User Index [AL] Frederica Kuster, PA-C    Patient with right flank pain.  CT shows normal appendix and no signs of pyelonephritis as well as a 10.5 necrotic/degenerating fibroid.  Labs show hemoglobin 11.1, otherwise unremarkable.  UA shows nitrite positive, but otherwise negative.  Urine culture sent.  Considering nitrite positive, will cover for a sending urinary tract infection.  We will treat with Keflex.  Will discharge home with ibuprofen and short course of Norco for pain control.  Also discharged home with Zofran.  I reviewed the [place] narcotic database and found no discrepancies.  Patient will be referred to OB/GYN for further management of fibroid and follow-up to PCP if symptoms not improving.  Return precautions discussed.  Patient understands and agrees with plan.  Patient vitals stable throughout ED course and discharged in satisfactory condition.  Final Clinical Impressions(s) / ED Diagnoses   Final diagnoses:  Right flank pain  Lower urinary tract infectious disease    ED Discharge Orders         Ordered    cephALEXin (KEFLEX) 500 MG capsule  4 times daily     10/01/17 1008    ibuprofen (ADVIL,MOTRIN) 600 MG tablet  Every 6 hours PRN     10/01/17 1008    HYDROcodone-acetaminophen (NORCO/VICODIN) 5-325 MG tablet  Every 6 hours PRN     10/01/17 1008    ondansetron (ZOFRAN) 4 MG tablet  Every 6 hours     10/01/17 1008           Sheriann Newmann, [place], PA-C 10/01/17 1030    Veryl Speak, MD 10/01/17 [phone]

## 2017-10-01 NOTE — ED Notes (Signed)
Urine culture sent down with UA. 

## 2017-10-02 LAB — URINE CULTURE: Culture: NO GROWTH

## 2017-10-11 DIAGNOSIS — I10 Essential (primary) hypertension: Secondary | ICD-10-CM | POA: Diagnosis not present

## 2017-10-11 DIAGNOSIS — T783XXA Angioneurotic edema, initial encounter: Secondary | ICD-10-CM | POA: Diagnosis not present

## 2017-10-11 DIAGNOSIS — D251 Intramural leiomyoma of uterus: Secondary | ICD-10-CM | POA: Diagnosis not present

## 2017-10-11 DIAGNOSIS — D250 Submucous leiomyoma of uterus: Secondary | ICD-10-CM | POA: Diagnosis not present

## 2017-10-12 ENCOUNTER — Ambulatory Visit: Payer: BLUE CROSS/BLUE SHIELD | Admitting: Obstetrics and Gynecology

## 2017-10-12 ENCOUNTER — Encounter: Payer: Self-pay | Admitting: Obstetrics and Gynecology

## 2017-10-12 ENCOUNTER — Other Ambulatory Visit: Payer: Self-pay | Admitting: Physician Assistant

## 2017-10-12 ENCOUNTER — Other Ambulatory Visit: Payer: Self-pay

## 2017-10-12 ENCOUNTER — Telehealth: Payer: Self-pay | Admitting: Obstetrics and Gynecology

## 2017-10-12 VITALS — BP 142/88 | HR 70 | Resp 16 | Ht 67.25 in | Wt 208.0 lb

## 2017-10-12 DIAGNOSIS — D219 Benign neoplasm of connective and other soft tissue, unspecified: Secondary | ICD-10-CM

## 2017-10-12 DIAGNOSIS — D649 Anemia, unspecified: Secondary | ICD-10-CM | POA: Diagnosis not present

## 2017-10-12 DIAGNOSIS — N921 Excessive and frequent menstruation with irregular cycle: Secondary | ICD-10-CM

## 2017-10-12 DIAGNOSIS — N6320 Unspecified lump in the left breast, unspecified quadrant: Secondary | ICD-10-CM

## 2017-10-12 NOTE — Telephone Encounter (Signed)
Call placed to convey benefits for appointment scheduled 10/13/17.

## 2017-10-12 NOTE — Progress Notes (Signed)
Patient scheduled while still in office for PUS/EMB on Thursday 10-13-17 at 1030 with 1100 consult with Dr. Quincy Simmonds. Patient agreeable to date and time of appointment.

## 2017-10-12 NOTE — Progress Notes (Signed)
GYNECOLOGY  VISIT   HPI: 42 y.o.   Single  African American  female   [phone] with Patient's last menstrual period was 10/07/2017 (exact date).   here as a NGYN for follow up of fibroids.  She states she has pelvic pain for weeks.  Pain can be dull menstrual cramps or sharp pain.  Can radiate into her back or groin and leg.  Ibuprofen is working somewhat to treat her pain.   Reports pelvic pressure, urinary frequency, and heavy menstruation that prevents her from working some days.  Bleeding causes her to change her pap every 1 - 2 hours during the first day.   Has some fatigue and decreased energy.  (Normal thyroid testing in June 2019.) Takes a multivitamin.   Went to the ER 10/01/17 for right flank pain.  She was treated with Keflex for a presumptive UTI and discharged to home with Norco, ibuprofen, and Zofran. Her UC was negative on final. CBC showed WBC 9.8 and Hgb 11.1.  CT of abdomen and pelvis 10/01/17 for RLQ pain.  Had a 8.98 x 8.1 x 10.5 cm necrotic degenerating subserosal fibroid in right posterior fundus. Ovaries were normal and had a left CL cyst.  Normal appendix.  Small volume ascites.  No hydronephrosis.   Pelvic US 09/2013 in Epic showing multiple fibroids with larges being subserosal 5.4 x 3.6 x 3.4 cm.  Ovaries were normal.  See Epic.  Declines future childbearing.   Works for [employer].   GYNECOLOGIC HISTORY: Patient's last menstrual period was 10/07/2017 (exact date). Contraception:  BTL Menopausal hormone therapy:  none Last mammogram:  8/15 normal per patient Last pap smear:   06-16-17 neg HPV HR neg        OB History    Gravida  3   Para  2   Term  2   Preterm      AB  1   Living        SAB      TAB  1   Ectopic      Multiple      Live Births                 Patient Active Problem List   Diagnosis Date Noted  . Class 1 obesity due to excess calories with serious comorbidity and body mass index (BMI) of 33.0 to  33.9 in adult 07/18/2017  . Normocytic anemia due to blood loss 05/10/2017  . Moderate major depression, single episode ([place]) 05/10/2017  . Hypertriglyceridemia without hypercholesterolemia 05/10/2017  . Leiomyoma of uterus, unspecified 09/06/2013  . BP (high blood pressure) 08/09/2013    Past Medical History:  Diagnosis Date  . Abnormal Pap smear of cervix 1994?  Marland Kitchen Anemia   . Anxiety   . Depression   . Fibroid   . Hypertension     Past Surgical History:  Procedure Laterality Date  . COLPOSCOPY  1994?  . TUBAL LIGATION  06/2003    Current Outpatient Medications  Medication Sig Dispense Refill  . ibuprofen (ADVIL,MOTRIN) 600 MG tablet Take 1 tablet (600 mg total) by mouth every 6 (six) hours as needed. 30 tablet 0  . Multiple Vitamin (MULTIVITAMIN) tablet Take 1 tablet by mouth daily.    Marland Kitchen losartan-hydrochlorothiazide (HYZAAR) 50-12.5 MG tablet Take by mouth.     No current facility-administered medications for this visit.      ALLERGIES: Cephalexin and Lisinopril  Family History  Problem Relation Age of Onset  .  Thyroid disease Mother   . Hypertension Father   . Hypertension Maternal Grandmother   . Diabetes Maternal Grandmother   . Hypertension Paternal Grandmother   . Diabetes Paternal Grandmother   . Breast cancer Paternal Grandmother   . Stroke Paternal Grandmother     Social History   Socioeconomic History  . Marital status: Single    Spouse name: Not on file  . Number of children: Not on file  . Years of education: Not on file  . Highest education level: Not on file  Occupational History  . Not on file  Social Needs  . Financial resource strain: Not on file  . Food insecurity:    Worry: Not on file    Inability: Not on file  . Transportation needs:    Medical: Not on file    Non-medical: Not on file  Tobacco Use  . Smoking status: Current Every Day Smoker    Types: Cigars  . Smokeless tobacco: Never Used  Substance and Sexual Activity  .  Alcohol use: Yes    Alcohol/week: 1.0 standard drinks    Types: 1 Standard drinks or equivalent per week  . Drug use: No  . Sexual activity: Yes    Partners: Male    Birth control/protection: None  Lifestyle  . Physical activity:    Days per week: Not on file    Minutes per session: Not on file  . Stress: Not on file  Relationships  . Social connections:    Talks on phone: Not on file    Gets together: Not on file    Attends religious service: Not on file    Active member of club or organization: Not on file    Attends meetings of clubs or organizations: Not on file    Relationship status: Not on file  . Intimate partner violence:    Fear of current or ex partner: Not on file    Emotionally abused: Not on file    Physically abused: Not on file    Forced sexual activity: Not on file  Other Topics Concern  . Not on file  Social History Narrative  . Not on file    Review of Systems  Constitutional: Negative.   HENT: Negative.   Eyes: Negative.   Respiratory: Negative.   Cardiovascular: Negative.   Gastrointestinal: Negative.   Endocrine: Negative.   Genitourinary: Positive for pelvic pain.       Heavy menstrual cycle  Allergic/Immunologic: Negative.   Neurological: Negative.   Hematological: Negative.   Psychiatric/Behavioral: Negative.     PHYSICAL EXAMINATION:    BP (!) 142/88   Pulse 70   Resp 16   Ht 5' 7.25" (1.708 m)   Wt 208 lb (94.3 kg)   LMP 10/07/2017 (Exact Date)   BMI 32.34 kg/m     General appearance: alert, cooperative and appears stated age Head: Normocephalic, without obvious abnormality, atraumatic Neck: no adenopathy, supple, symmetrical, trachea midline and thyroid normal to inspection and palpation Lungs: clear to auscultation bilaterally Breasts: normal appearance, no masses or tenderness, No nipple retraction or dimpling, No nipple discharge or bleeding, No axillary or supraclavicular adenopathy Heart: regular rate and rhythm Abdomen:  soft, non-tender,18 week size mass in lower abdomen/pelvis. Extremities: extremities normal, atraumatic, no cyanosis or edema Skin: Skin color, texture, turgor normal. No rashes or lesions Lymph nodes: Cervical, supraclavicular, and axillary nodes normal. No abnormal inguinal nodes palpated Neurologic: Grossly normal  Pelvic: External genitalia:  no lesions  Urethra:  normal appearing urethra with no masses, tenderness or lesions              Bartholins and Skenes: normal                 Vagina: normal appearing vagina with normal color and discharge, no lesions              Cervix: no lesions                Bimanual Exam:  Uterus:   18 week size, minimally tender uterus.  Mobile.  Does not fill the pelvis.               Adnexa: no mass, fullness, tenderness              Rectal exam: Yes.  .  Confirms.              Anus:  normal sphincter tone, no lesions  Chaperone was present for exam.  ASSESSMENT  Fibroids.  Large degenerating fibroid.  Prolonged menstruation.  Status post BTL.  Mild anemia.  HTN.  Depression and anxiety.   PLAN  Discussion of fibroids.  Return for pelvic US and possible EMB.  Procedures explained.  We talked about treatment options - Depo Provera, Depo Lupron, laparoscopic hysterectomy with bilateral salpingectomy, uterine artery embolization.  ACOG HO on fibroids.  Check iron and ferritin.  Update mammogram.  Information given for facilities in [place].   An After Visit Summary was printed and given to the patient.  _30_____ minutes face to face time of which over 50% was spent in counseling.

## 2017-10-13 ENCOUNTER — Ambulatory Visit: Payer: BLUE CROSS/BLUE SHIELD | Admitting: Obstetrics and Gynecology

## 2017-10-13 ENCOUNTER — Encounter: Payer: Self-pay | Admitting: Obstetrics and Gynecology

## 2017-10-13 ENCOUNTER — Ambulatory Visit (INDEPENDENT_AMBULATORY_CARE_PROVIDER_SITE_OTHER): Payer: BLUE CROSS/BLUE SHIELD

## 2017-10-13 ENCOUNTER — Other Ambulatory Visit: Payer: Self-pay

## 2017-10-13 VITALS — BP 140/84 | HR 92 | Wt 207.0 lb

## 2017-10-13 DIAGNOSIS — D259 Leiomyoma of uterus, unspecified: Secondary | ICD-10-CM | POA: Diagnosis not present

## 2017-10-13 DIAGNOSIS — N921 Excessive and frequent menstruation with irregular cycle: Secondary | ICD-10-CM

## 2017-10-13 DIAGNOSIS — D219 Benign neoplasm of connective and other soft tissue, unspecified: Secondary | ICD-10-CM | POA: Diagnosis not present

## 2017-10-13 DIAGNOSIS — R9389 Abnormal findings on diagnostic imaging of other specified body structures: Secondary | ICD-10-CM

## 2017-10-13 LAB — IRON: IRON: 28 ug/dL (ref 27–159)

## 2017-10-13 LAB — FERRITIN: Ferritin: 14 ng/mL — ABNORMAL LOW (ref 15–150)

## 2017-10-13 NOTE — Patient Instructions (Signed)
Endometrial Biopsy, Care After This sheet gives you information about how to care for yourself after your procedure. Your health care provider may also give you more specific instructions. If you have problems or questions, contact your health care provider. What can I expect after the procedure? After the procedure, it is common to have:  Mild cramping.  A small amount of vaginal bleeding for a few days. This is normal.  Follow these instructions at home:  Take over-the-counter and prescription medicines only as told by your health care provider.  Do not douche, use tampons, or have sexual intercourse until your health care provider approves.  Return to your normal activities as told by your health care provider. Ask your health care provider what activities are safe for you.  Follow instructions from your health care provider about any activity restrictions, such as restrictions on strenuous exercise or heavy lifting. Contact a health care provider if:  You have heavy bleeding, or bleed for longer than 2 days after the procedure.  You have bad smelling discharge from your vagina.  You have a fever or chills.  You have a burning sensation when urinating or you have difficulty urinating.  You have severe pain in your lower abdomen. Get help right away if:  You have severe cramps in your stomach or back.  You pass large blood clots.  Your bleeding increases.  You become weak or light-headed, or you pass out. Summary  After the procedure, it is common to have mild cramping and a small amount of vaginal bleeding for a few days.  Do not douche, use tampons, or have sexual intercourse until your health care provider approves.  Return to your normal activities as told by your health care provider. Ask your health care provider what activities are safe for you. This information is not intended to replace advice given to you by your health care provider. Make sure you discuss any  questions you have with your health care provider. Document Released: 10/18/2012 Document Revised: 01/14/2016 Document Reviewed: 01/14/2016 Elsevier Interactive Patient Education  2017 [employer].

## 2017-10-13 NOTE — Progress Notes (Signed)
GYNECOLOGY  VISIT   HPI: 42 y.o.   Single  African American  female   [id number] with Patient's last menstrual period was 10/07/2017 (exact date).   here for pelvic ultrasound and endometrial biopsy.   Had a CT scan done for flank pain and large degenerating fibroid noted.   She has pelvic pain, urinary frequency, and prolonged and heavy menses.   Considering hysterectomy.  Has completed childbearing.  She does not want to have recurrence of fibroids.  She is very sexually active with her partner and concerned about sexual functioning after hysterectomy.   GYNECOLOGIC HISTORY: Patient's last menstrual period was 10/07/2017 (exact date). Contraception:  Tubal ligation Menopausal hormone therapy:  none Last mammogram:  2015 normal per patient Last pap smear:   06-16-17 neg HPV HR neg        OB History    Gravida  3   Para  2   Term  2   Preterm      AB  1   Living        SAB      TAB  1   Ectopic      Multiple      Live Births                 Patient Active Problem List   Diagnosis Date Noted  . Class 1 obesity due to excess calories with serious comorbidity and body mass index (BMI) of 33.0 to 33.9 in adult 07/18/2017  . Normocytic anemia due to blood loss 05/10/2017  . Moderate major depression, single episode ([place]) 05/10/2017  . Hypertriglyceridemia without hypercholesterolemia 05/10/2017  . Leiomyoma of uterus, unspecified 09/06/2013  . BP (high blood pressure) 08/09/2013    Past Medical History:  Diagnosis Date  . Abnormal Pap smear of cervix 1994?  Marland Kitchen Anemia   . Anxiety   . Depression   . Fibroid   . Hypertension     Past Surgical History:  Procedure Laterality Date  . COLPOSCOPY  1994?  . TUBAL LIGATION  06/2003    Current Outpatient Medications  Medication Sig Dispense Refill  . escitalopram (LEXAPRO) 5 MG tablet Take by mouth.    Marland Kitchen ibuprofen (ADVIL,MOTRIN) 600 MG tablet Take 1 tablet (600 mg total) by mouth every 6 (six) hours  as needed. 30 tablet 0  . losartan-hydrochlorothiazide (HYZAAR) 50-12.5 MG tablet Take by mouth.    . Multiple Vitamin (MULTIVITAMIN) tablet Take 1 tablet by mouth daily.     No current facility-administered medications for this visit.      ALLERGIES: Cephalexin and Lisinopril  Family History  Problem Relation Age of Onset  . Thyroid disease Mother   . Hypertension Father   . Hypertension Maternal Grandmother   . Diabetes Maternal Grandmother   . Hypertension Paternal Grandmother   . Diabetes Paternal Grandmother   . Breast cancer Paternal Grandmother   . Stroke Paternal Grandmother     Social History   Socioeconomic History  . Marital status: Single    Spouse name: Not on file  . Number of children: Not on file  . Years of education: Not on file  . Highest education level: Not on file  Occupational History  . Not on file  Social Needs  . Financial resource strain: Not on file  . Food insecurity:    Worry: Not on file    Inability: Not on file  . Transportation needs:    Medical: Not on file  Non-medical: Not on file  Tobacco Use  . Smoking status: Current Every Day Smoker    Types: Cigars  . Smokeless tobacco: Never Used  Substance and Sexual Activity  . Alcohol use: Yes    Alcohol/week: 1.0 standard drinks    Types: 1 Standard drinks or equivalent per week  . Drug use: No  . Sexual activity: Yes    Partners: Male    Birth control/protection: None  Lifestyle  . Physical activity:    Days per week: Not on file    Minutes per session: Not on file  . Stress: Not on file  Relationships  . Social connections:    Talks on phone: Not on file    Gets together: Not on file    Attends religious service: Not on file    Active member of club or organization: Not on file    Attends meetings of clubs or organizations: Not on file    Relationship status: Not on file  . Intimate partner violence:    Fear of current or ex partner: Not on file    Emotionally abused:  Not on file    Physically abused: Not on file    Forced sexual activity: Not on file  Other Topics Concern  . Not on file  Social History Narrative  . Not on file    Review of Systems  Constitutional: Negative.   HENT: Negative.   Eyes: Negative.   Respiratory: Negative.   Cardiovascular: Negative.   Gastrointestinal: Negative.   Endocrine: Negative.   Genitourinary: Negative.        Pain or bleeding with intercourse Excess bleeding Unscheduled bleeding or spotting  Musculoskeletal: Negative.   Skin: Negative.   Allergic/Immunologic: Negative.   Neurological: Negative.   Hematological: Negative.   Psychiatric/Behavioral: Negative.   All other systems reviewed and are negative.   PHYSICAL EXAMINATION:    BP 140/84   Pulse 92   Wt 207 lb (93.9 kg)   LMP 10/07/2017 (Exact Date)   SpO2 99%   BMI 32.18 kg/m     General appearance: alert, cooperative and appears stated age  Pelvic: External genitalia:  no lesions              Urethra:  normal appearing urethra with no masses, tenderness or lesions              Bartholins and Skenes: normal                 Vagina: normal appearing vagina with normal color and discharge, no lesions              Cervix: no lesions                Bimanual Exam:  Uterus:   18 week size, mobile uterus, nontender.              Adnexa: no mass, fullness, tenderness        Pelvic US Uterus with multiple fibroids. 2.78 cm, 3.31 cm, 9.52 cm. EMS 12.53 mm with sliver of fluid.  Normal ovaries.  No free fluid.   EMB: Consent for procedure. Sterile prep with   Paracervical block with 1% lidocaine 6 cc, lot number   [id number], expiration 1/23 Tenaculum to anterior cervical lip. Pipelle passed to    8      cm twice.   Tissue to pathology.  Minimal EBL. No complications.   Chaperone was present for exam.  ASSESSMENT  Symptomatic fibroids.  Degenerating fibroid on CT scan.  Prolonged and heavy menses. Thickened endometrium.    PLAN  FU EMB.  Post EMB instructions given.  We discussed fibroids in detail.  I am recommending a pelvic MRI to evaluate the large fibroid better and determine if has features consistent with sarcoma.  We reviewed sexual functioning before and after hysterectomy.  I explained she will not have uterine contractions with orgasm after hysterectomy. We discussed laparoscopic hysterectomy with removal of bilateral tubes and maintenance of ovaries as long as they are normal.  We reviewed morcellation of the specimen within an Alexis bag through either a vaginal or umbilical incision.  Risks of hysterectomy may include but are not limited to bleeding, infection, damage to surrounding organs - bladder, bowel, urinary system, blood vessels, reaction to anesthesia, pneumonia, DVT, PE, death, and need for reoperation. If MRI suspicious for sarcoma, will refer to GYN ONC. ACOG HO on hysterectomy to patient.    An After Visit Summary was printed and given to the patient.  ____25__ minutes face to face time of which over 50% was spent in counseling.

## 2017-10-19 ENCOUNTER — Encounter: Payer: BLUE CROSS/BLUE SHIELD | Admitting: Obstetrics and Gynecology

## 2017-10-19 ENCOUNTER — Encounter

## 2017-10-26 ENCOUNTER — Ambulatory Visit
Admission: RE | Admit: 2017-10-26 | Discharge: 2017-10-26 | Disposition: A | Discharge status: Home / Self Care | Payer: BLUE CROSS/BLUE SHIELD | Source: Ambulatory Visit | Attending: Physician Assistant | Admitting: Physician Assistant

## 2017-10-26 ENCOUNTER — Other Ambulatory Visit: Payer: Self-pay | Admitting: Physician Assistant

## 2017-10-26 ENCOUNTER — Ambulatory Visit
Admission: RE | Admit: 2017-10-26 | Discharge: 2017-10-26 | Disposition: A | Discharge status: Home / Self Care | Payer: BLUE CROSS/BLUE SHIELD | Source: Ambulatory Visit | Attending: Obstetrics and Gynecology | Admitting: Obstetrics and Gynecology

## 2017-10-26 ENCOUNTER — Ambulatory Visit
Admission: RE | Admit: 2017-10-26 | Discharge: 2017-10-26 | Disposition: A | Discharge status: Home / Self Care | Payer: 59 | Source: Ambulatory Visit | Attending: Physician Assistant | Admitting: Physician Assistant

## 2017-10-26 DIAGNOSIS — N6012 Diffuse cystic mastopathy of left breast: Secondary | ICD-10-CM | POA: Diagnosis not present

## 2017-10-26 DIAGNOSIS — D259 Leiomyoma of uterus, unspecified: Secondary | ICD-10-CM

## 2017-10-26 DIAGNOSIS — N6320 Unspecified lump in the left breast, unspecified quadrant: Secondary | ICD-10-CM

## 2017-10-26 DIAGNOSIS — N6011 Diffuse cystic mastopathy of right breast: Secondary | ICD-10-CM | POA: Diagnosis not present

## 2017-10-26 DIAGNOSIS — N6310 Unspecified lump in the right breast, unspecified quadrant: Secondary | ICD-10-CM

## 2017-10-26 DIAGNOSIS — D252 Subserosal leiomyoma of uterus: Secondary | ICD-10-CM | POA: Diagnosis not present

## 2017-10-26 DIAGNOSIS — R922 Inconclusive mammogram: Secondary | ICD-10-CM | POA: Diagnosis not present

## 2017-10-26 MED ORDER — GADOBENATE DIMEGLUMINE 529 MG/ML IV SOLN
19.0000 mL | Freq: Once | INTRAVENOUS | Status: AC | PRN
  Administered 2017-10-26: 19 mL via INTRAVENOUS

## 2017-11-04 ENCOUNTER — Telehealth: Payer: Self-pay | Admitting: Obstetrics and Gynecology

## 2017-11-04 NOTE — Telephone Encounter (Signed)
Spoke with patient. Patient states she would like to proceed with hysterectomy as discussed in office on 10/13/17.   Advised patient I will update Dr. Quincy Simmonds. Our benefits department will review benefits and return call. Gay Filler will then return call regarding surgery planning. Patient verbalizes understanding.   Routing to Dr. Quincy Simmonds  Cc: Lerry Liner, Lamont Snowball, RN

## 2017-11-04 NOTE — Telephone Encounter (Signed)
Please contact patient to schedule a follow up appointment regarding her uterine fibroids.   I released results of her MRI of the pelvis several days ago.  It showed fibroid degeneration.

## 2017-11-07 NOTE — Telephone Encounter (Signed)
Plan will be for laparoscopic hysterectomy with bilateral salpingectomy, cystoscopy for uterine fibroids.  Please precert and schedule.   Cc - Lamont Snowball, Lerry Liner

## 2017-11-08 NOTE — Telephone Encounter (Signed)
Call placed to patient to review benefits for surgery. Left voicemail message requesting a return call   cc: Lamont Snowball, RN

## 2017-11-08 NOTE — Telephone Encounter (Signed)
Patient returned call. Spoke with patient regarding benefit for surgery. Patient understood and agreeable. Patient aware this is professional benefit only. Patient aware once surgery has been scheduled, she will be contacted by hospital for separate benefits. Patient advised she will call back to confirm and schedule.   cc; Lamont Snowball, RN

## 2017-12-06 ENCOUNTER — Telehealth: Payer: Self-pay | Admitting: Obstetrics and Gynecology

## 2017-12-06 NOTE — Telephone Encounter (Signed)
Returned call to patient. Patient states she is interested in proceeding with surgery, as discussed in October. Advised patient will review with nurse supervisor.    forwarding to Lamont Snowball, RN

## 2017-12-06 NOTE — Telephone Encounter (Signed)
Patient is returning Suzy's call.

## 2017-12-12 NOTE — Telephone Encounter (Signed)
Call to patient. Per ROI, can leave message on voice mail which has number confirmation. Left message advising first available surgery date is in 2020. Requested call back to discuss.

## 2017-12-15 NOTE — Telephone Encounter (Signed)
Spoke with patient in regards to 2020 benefits for surgery. Patient understood and agreeable. Patient advises she will call back to confirm. Patient aware this is professional benefit only. Patient aware will be contacted by hospital for separate benefits, once surgery date has been established.    cc: Lamont Snowball, RN

## 2017-12-15 NOTE — Telephone Encounter (Signed)
Patient left message returning call while computers/EPIC were down.

## 2017-12-15 NOTE — Telephone Encounter (Signed)
Return call to patient. Reviewed surgery date options and scheduling policy. Patient would like to review 2020 benefits with business office.

## 2017-12-16 NOTE — Telephone Encounter (Signed)
Call to patient. Advised Dr Quincy Simmonds recommends Lupron prior to surgery. Patient would like to discuss plans for surgery.  Agreeable to proceed with lupron precert. Consult appointment scheduled for 12-19-17 with Dr Quincy Simmonds.   Order or Lupron precert faxed to [facility].   Routing to Dr Quincy Simmonds. Encounter closed.

## 2017-12-19 ENCOUNTER — Telehealth: Payer: Self-pay | Admitting: Obstetrics and Gynecology

## 2017-12-19 ENCOUNTER — Ambulatory Visit: Payer: BLUE CROSS/BLUE SHIELD | Admitting: Obstetrics and Gynecology

## 2017-12-19 NOTE — Telephone Encounter (Signed)
Patient called to cancel appointment today with Dr Quincy Simmonds, to review Lupron. Patient states she is sick and unable to make appointment. Patient has rescheduled 12/23/17 with Dr Quincy Simmonds.  Forwarding to Dr Quincy Simmonds for final review. Patient is agreeable to disposition. Will close encounter   cc: Lamont Snowball, RN

## 2017-12-19 NOTE — Progress Notes (Deleted)
GYNECOLOGY  VISIT   HPI: 42 y.o.   Single  African American  female   [id number] with No LMP recorded.   here for consult regarding Lupron.   GYNECOLOGIC HISTORY: No LMP recorded. Contraception:  Tubal Menopausal hormone therapy:  none Last mammogram: 10-26-17 diag.Bil.w/US --density C/No focal abnormality over the upper central left breast to account for patient's palpable abnormality. A couple small incidental cysts over the 12 o'clock position of the left breast.  Probable complicated cyst over the 8 o'clock position of the right breast 4 cm from the nipple measuring 0.7 x 1.2 x 1.3 cm. Rec.20monthRt.Diag.& US./BiRads3 Last pap smear:  09-06-13 Neg        OB History    Gravida  3   Para  2   Term  2   Preterm      AB  1   Living        SAB      TAB  1   Ectopic      Multiple      Live Births                 Patient Active Problem List   Diagnosis Date Noted  . Class 1 obesity due to excess calories with serious comorbidity and body mass index (BMI) of 33.0 to 33.9 in adult 07/18/2017  . Normocytic anemia due to blood loss 05/10/2017  . Moderate major depression, single episode ([place] 05/10/2017  . Hypertriglyceridemia without hypercholesterolemia 05/10/2017  . Leiomyoma of uterus, unspecified 09/06/2013  . BP (high blood pressure) 08/09/2013    Past Medical History:  Diagnosis Date  . Abnormal Pap smear of cervix 1994?  .Marland KitchenAnemia   . Anxiety   . Depression   . Fibroid   . Hypertension     Past Surgical History:  Procedure Laterality Date  . COLPOSCOPY  1994?  . TUBAL LIGATION  06/2003    Current Outpatient Medications  Medication Sig Dispense Refill  . escitalopram (LEXAPRO) 5 MG tablet Take by mouth.    .Marland Kitchenibuprofen (ADVIL,MOTRIN) 600 MG tablet Take 1 tablet (600 mg total) by mouth every 6 (six) hours as needed. 30 tablet 0  . losartan-hydrochlorothiazide (HYZAAR) 50-12.5 MG tablet Take by mouth.    . Multiple Vitamin (MULTIVITAMIN) tablet  Take 1 tablet by mouth daily.     No current facility-administered medications for this visit.      ALLERGIES: Cephalexin and Lisinopril  Family History  Problem Relation Age of Onset  . Thyroid disease Mother   . Hypertension Father   . Hypertension Maternal Grandmother   . Diabetes Maternal Grandmother   . Hypertension Paternal Grandmother   . Diabetes Paternal Grandmother   . Breast cancer Paternal Grandmother   . Stroke Paternal Grandmother     Social History   Socioeconomic History  . Marital status: Single    Spouse name: Not on file  . Number of children: Not on file  . Years of education: Not on file  . Highest education level: Not on file  Occupational History  . Not on file  Social Needs  . Financial resource strain: Not on file  . Food insecurity:    Worry: Not on file    Inability: Not on file  . Transportation needs:    Medical: Not on file    Non-medical: Not on file  Tobacco Use  . Smoking status: Current Every Day Smoker    Types: Cigars  . Smokeless tobacco: Never  Used  Substance and Sexual Activity  . Alcohol use: Yes    Alcohol/week: 1.0 standard drinks    Types: 1 Standard drinks or equivalent per week  . Drug use: No  . Sexual activity: Yes    Partners: Male    Birth control/protection: None  Lifestyle  . Physical activity:    Days per week: Not on file    Minutes per session: Not on file  . Stress: Not on file  Relationships  . Social connections:    Talks on phone: Not on file    Gets together: Not on file    Attends religious service: Not on file    Active member of club or organization: Not on file    Attends meetings of clubs or organizations: Not on file    Relationship status: Not on file  . Intimate partner violence:    Fear of current or ex partner: Not on file    Emotionally abused: Not on file    Physically abused: Not on file    Forced sexual activity: Not on file  Other Topics Concern  . Not on file  Social History  Narrative  . Not on file    Review of Systems  PHYSICAL EXAMINATION:    There were no vitals taken for this visit.    General appearance: alert, cooperative and appears stated age Head: Normocephalic, without obvious abnormality, atraumatic Neck: no adenopathy, supple, symmetrical, trachea midline and thyroid normal to inspection and palpation Lungs: clear to auscultation bilaterally Breasts: normal appearance, no masses or tenderness, No nipple retraction or dimpling, No nipple discharge or bleeding, No axillary or supraclavicular adenopathy Heart: regular rate and rhythm Abdomen: soft, non-tender, no masses,  no organomegaly Extremities: extremities normal, atraumatic, no cyanosis or edema Skin: Skin color, texture, turgor normal. No rashes or lesions Lymph nodes: Cervical, supraclavicular, and axillary nodes normal. No abnormal inguinal nodes palpated Neurologic: Grossly normal  Pelvic: External genitalia:  no lesions              Urethra:  normal appearing urethra with no masses, tenderness or lesions              Bartholins and Skenes: normal                 Vagina: normal appearing vagina with normal color and discharge, no lesions              Cervix: no lesions                Bimanual Exam:  Uterus:  normal size, contour, position, consistency, mobility, non-tender              Adnexa: no mass, fullness, tenderness              Rectal exam: {yes no:314532}.  Confirms.              Anus:  normal sphincter tone, no lesions  Chaperone was present for exam.  ASSESSMENT     PLAN     An After Visit Summary was printed and given to the patient.  ______ minutes face to face time of which over 50% was spent in counseling.

## 2017-12-22 ENCOUNTER — Encounter: Payer: Self-pay | Admitting: Obstetrics and Gynecology

## 2017-12-22 ENCOUNTER — Ambulatory Visit: Payer: BLUE CROSS/BLUE SHIELD | Admitting: Obstetrics and Gynecology

## 2017-12-22 ENCOUNTER — Other Ambulatory Visit: Payer: Self-pay

## 2017-12-22 VITALS — BP 118/68 | HR 100 | Ht 67.25 in | Wt 206.0 lb

## 2017-12-22 DIAGNOSIS — D219 Benign neoplasm of connective and other soft tissue, unspecified: Secondary | ICD-10-CM | POA: Diagnosis not present

## 2017-12-22 NOTE — Progress Notes (Signed)
GYNECOLOGY  VISIT   HPI: 42 y.o.   Single  African American  female   [phone] with Patient's last menstrual period was 12/01/2017 (exact date).   here for consult regarding Lupron and potential hysterectomy.   She has known large fibroids and a 10 cm degenerating fibroid. Had CT, [place], and MRI. EMB showing benign proliferative endometrium.  First couple of days of menses are painful and heavy.  Pain is always on there right side and radiating.  Ibuprofen helps.  Rare use of Vicodin which she received in the ER.   Hgb 11.1 on 10/01/17.  Has low ferritin.  Taking iron once a day and takes MVI also.  One year hx of urinary incontinence with coughing, laughing, and sneezing.  Wears a pad.  Some urgency to void with urinary incontinence.  Had 2 vaginal deliveries and has not had leakage until one year ago.   Has not started Rx for Chantix yet.   GYNECOLOGIC HISTORY: Patient's last menstrual period was 12/01/2017 (exact date). Contraception: Tubal Menopausal hormone therapy: none Last mammogram: 10-26-17 Diag.Bil.w/US--A couple small incidental cystsover the 12 o'clock position of the left breast.Probable complicated cyst over the 8 o'clock position of the right breast 4 cm from the nipple measuring 0.7 x 1.2 x 1.3 cm.Recommend a six-month follow-up diagnostic right breast ultrasound to document stability Last pap smear:  06-16-17 neg HPV HR neg        OB History    Gravida  3   Para  2   Term  2   Preterm      AB  1   Living        SAB      TAB  1   Ectopic      Multiple      Live Births                 Patient Active Problem List   Diagnosis Date Noted  . Class 1 obesity due to excess calories with serious comorbidity and body mass index (BMI) of 33.0 to 33.9 in adult 07/18/2017  . Normocytic anemia due to blood loss 05/10/2017  . Moderate major depression, single episode ([place]) 05/10/2017  . Hypertriglyceridemia without hypercholesterolemia 05/10/2017  .  Leiomyoma of uterus, unspecified 09/06/2013  . BP (high blood pressure) 08/09/2013    Past Medical History:  Diagnosis Date  . Abnormal Pap smear of cervix 1994?  Marland Kitchen Anemia   . Anxiety   . Depression   . Fibroid   . Hypertension     Past Surgical History:  Procedure Laterality Date  . COLPOSCOPY  1994?  . TUBAL LIGATION  06/2003    Current Outpatient Medications  Medication Sig Dispense Refill  . escitalopram (LEXAPRO) 5 MG tablet Take by mouth.    Marland Kitchen ibuprofen (ADVIL,MOTRIN) 600 MG tablet Take 1 tablet (600 mg total) by mouth every 6 (six) hours as needed. 30 tablet 0  . losartan-hydrochlorothiazide (HYZAAR) 50-12.5 MG tablet Take by mouth.    . Multiple Vitamin (MULTIVITAMIN) tablet Take 1 tablet by mouth daily.     No current facility-administered medications for this visit.      ALLERGIES: Cephalexin and Lisinopril  Family History  Problem Relation Age of Onset  . Thyroid disease Mother   . Hypertension Father   . Hypertension Maternal Grandmother   . Diabetes Maternal Grandmother   . Hypertension Paternal Grandmother   . Diabetes Paternal Grandmother   . Breast cancer Paternal Grandmother   .  Stroke Paternal Grandmother     Social History   Socioeconomic History  . Marital status: Single    Spouse name: Not on file  . Number of children: Not on file  . Years of education: Not on file  . Highest education level: Not on file  Occupational History  . Not on file  Social Needs  . Financial resource strain: Not on file  . Food insecurity:    Worry: Not on file    Inability: Not on file  . Transportation needs:    Medical: Not on file    Non-medical: Not on file  Tobacco Use  . Smoking status: Current Every Day Smoker    Types: Cigars  . Smokeless tobacco: Never Used  Substance and Sexual Activity  . Alcohol use: Yes    Alcohol/week: 1.0 standard drinks    Types: 1 Standard drinks or equivalent per week  . Drug use: No  . Sexual activity: Yes     Partners: Male    Birth control/protection: None  Lifestyle  . Physical activity:    Days per week: Not on file    Minutes per session: Not on file  . Stress: Not on file  Relationships  . Social connections:    Talks on phone: Not on file    Gets together: Not on file    Attends religious service: Not on file    Active member of club or organization: Not on file    Attends meetings of clubs or organizations: Not on file    Relationship status: Not on file  . Intimate partner violence:    Fear of current or ex partner: Not on file    Emotionally abused: Not on file    Physically abused: Not on file    Forced sexual activity: Not on file  Other Topics Concern  . Not on file  Social History Narrative  . Not on file    Review of Systems  All other systems reviewed and are negative.   PHYSICAL EXAMINATION:    BP 118/68 (BP Location: Right Arm, Patient Position: Sitting, Cuff Size: Large)   Pulse 100   Ht 5' 7.25" (1.708 m)   Wt 206 lb (93.4 kg)   LMP 12/01/2017 (Exact Date)   BMI 32.02 kg/m     General appearance: alert, cooperative and appears stated age   Abdomen: soft, non-tender, mass to 2 cm below umbilicus, more right sided.   Pelvic: External genitalia:  no lesions              Urethra:  normal appearing urethra with no masses, tenderness or lesions              Bartholins and Skenes: normal                 Vagina: normal appearing vagina with normal color and discharge, no lesions              Cervix: no lesions                Bimanual Exam:  Uterus:  18 week size, more right sided than left.  More pelvic sidewall access on the left.  Uterus is nontender.               Adnexa: no mass, fullness, tenderness            Chaperone was present for exam.  ASSESSMENT  Mixed incontinence.  Fibroids.  Known large 10 cm degenerating fibroid  by prior imaging. Hx mild anemia and low ferritin.  Status post BTL. Tobacco use.  Has Rx for Chantix.   PLAN  We  discussed her fibroids and laparoscopic hysterectomy with preop depo Lupron treatment for 3 months.  We reviewed the risks and benefits of Depo Lupron.  Benefits are about a 40% reduction in uterine size which can decrease risk of anemia, blood loss with surgery, surgical time, and risk for conversion to laparotomy.  We talked about the expected increase in her bleeding following by cessation of her menstruation and then the onset of temporary menopausal symptoms.   She wishes to proceed with a 3 months course of Depo Lupron and then laparoscopic hysterectomy with bilateral salpingectomy, possible bilateral oophorectomy (only if abnormal), and cystoscopy.  She declines evaluation and concurrent surgical care for urinary incontinence.  She knows that her incontinence may improve with hysterectomy but that I cannot guarantee that it will be resolved following removal of her large uterus.  I recommended tobacco cessation, so she will start the Chantix.   An After Visit Summary was printed and given to the patient.  __25____ minutes face to face time of which over 50% was spent in counseling.

## 2018-01-02 ENCOUNTER — Telehealth: Payer: Self-pay | Admitting: *Deleted

## 2018-01-02 NOTE — Telephone Encounter (Signed)
Call to patient. Advised of phone number to [employer]. Patient to call and review benefit information and authorize shipment.  Once completed, will need to call back to office so we can arrange delivery.

## 2018-01-02 NOTE — Telephone Encounter (Signed)
Call to [employer] to check on status of Lupron prior auth.  [facility] has been [facility] to [facility]. Lupron is ready for shipment and is waiting for patient to call to authorize. Patient to call [phone].  After patient calls, we will need to call back as well to arrange shipment.

## 2018-01-09 NOTE — Telephone Encounter (Signed)
Spoke with Kara Morales at [employer]. Patient has authorized shipment of Lupron to [facility]. Lupron scheduled for delivery to [facility] on 01/10/18.

## 2018-01-12 NOTE — Telephone Encounter (Signed)
Update: [facility] called to report that the Lupron will be delivered tomorrow 01/13/2017

## 2018-01-16 NOTE — Telephone Encounter (Signed)
Spoke with patient. Patient states she spoke with Kara Morales, was advised [facility] is awaiting call from [facility] to schedule delivery of Lupron. Tubal ligation for contraceptive. LMP 12/28/17. Advised I will contact [facility] and schedule delivery.

## 2018-01-16 NOTE — Telephone Encounter (Signed)
Spoke with Angie with [facility]. Was advised patient did call today and authorize shipment of Lupron to [facility], however, [facility] did not review new copay benefits with patient. Was advised [facility] will contact patient to review new benefits.

## 2018-01-16 NOTE — Telephone Encounter (Signed)
Spoke with patient, advised as seen below. Patient will contact [facility] at [phone] to review benefits and authorize shipment to [facility]. After patient has reviewed benefits, can schedule shipment.

## 2018-01-16 NOTE — Telephone Encounter (Signed)
Spoke with [place] at [facility]. Was advised Lupron has not been shipped. Patients copay changed 01/2018, they have reached out to patient on 1/3 & 1/4 to review benefits and new copay, they are waiting on return call from patient.

## 2018-01-18 NOTE — Telephone Encounter (Signed)
Nunzio Cobbs, MD  Burnice Logan, RN        Ok for injection at any time.  She will need to do a urine pregnancy test on the day of the injection.     Call to patient, scheduled for nurse visit for Lupron injection on 01/19/18 at [place]. Patient verbalizes understanding and is agreeable.   Encounter closed.

## 2018-01-18 NOTE — Telephone Encounter (Signed)
Lupron delivered to office. Message to Dr. Quincy Simmonds to advise on scheduling injection.

## 2018-01-19 ENCOUNTER — Ambulatory Visit (INDEPENDENT_AMBULATORY_CARE_PROVIDER_SITE_OTHER): Payer: BLUE CROSS/BLUE SHIELD

## 2018-01-19 VITALS — BP 124/78 | HR 68 | Resp 14 | Ht 67.5 in | Wt 211.0 lb

## 2018-01-19 DIAGNOSIS — Z79818 Long term (current) use of other agents affecting estrogen receptors and estrogen levels: Secondary | ICD-10-CM | POA: Diagnosis not present

## 2018-01-19 DIAGNOSIS — D219 Benign neoplasm of connective and other soft tissue, unspecified: Secondary | ICD-10-CM

## 2018-01-19 LAB — POCT URINE PREGNANCY: PREG TEST UR: NEGATIVE

## 2018-01-19 MED ORDER — LEUPROLIDE ACETATE (3 MONTH) 11.25 MG IM KIT
11.2500 mg | PACK | Freq: Once | INTRAMUSCULAR | Status: AC
  Administered 2018-01-19: 11.25 mg via INTRAMUSCULAR

## 2018-01-19 MED ORDER — LEUPROLIDE ACETATE 3.75 MG IM KIT: 3.7500 mg | PACK | Freq: Once | INTRAMUSCULAR | Status: DC

## 2018-01-19 NOTE — Progress Notes (Signed)
43 y.o. Single African American female here for her first Depo Lupron injection.  Indication for injection:  Fibroids  LMP:  12/28/17  Contraception:  none  Lupron order:  11.31m IM x  dosages.  Ruoq  Order to administer given by Dr. SQuincy Simmonds on 12/22/17.

## 2018-03-06 ENCOUNTER — Telehealth: Payer: Self-pay | Admitting: Obstetrics and Gynecology

## 2018-03-06 DIAGNOSIS — D259 Leiomyoma of uterus, unspecified: Secondary | ICD-10-CM

## 2018-03-06 NOTE — Telephone Encounter (Signed)
Patient called requesting to know when to schedule her next Depo Lupron injection.

## 2018-03-08 NOTE — Telephone Encounter (Signed)
Dr. Quincy Simmonds,  Please advise regarding Lupron.  Pt had Lupron 11.25 mg on 01/19/2018.

## 2018-03-09 MED ORDER — LEUPROLIDE ACETATE (3 MONTH) 11.25 MG IM KIT: 11.2500 mg | PACK | INTRAMUSCULAR | 0 refills | Status: DC

## 2018-03-09 NOTE — Telephone Encounter (Signed)
[place] for after 04/20/18.

## 2018-03-09 NOTE — Telephone Encounter (Signed)
A second injection of Depo Lupron would be 11.25 mg and be done around 04/20/18.  Please let me know if the patient is still considering hysterectomy surgery for her uterine fibroids. We will need to coordinate a follow up pelvic US and start looking at potential surgical dates.  Cc - Lamont Snowball

## 2018-03-09 NOTE — Telephone Encounter (Signed)
Spoke with patient.  Agreeable to timeline.  Wants to plan for surgery as soon as possible.  Agreeable to scheduling pelvic ultrasound and follow up for surgical dates.  Schedule ultrasound for after 04/20/2018?

## 2018-03-14 ENCOUNTER — Telehealth: Payer: Self-pay | Admitting: Obstetrics and Gynecology

## 2018-03-14 NOTE — Telephone Encounter (Signed)
Message left to return call to Fordyce Lepak at [phone].    

## 2018-03-14 NOTE — Telephone Encounter (Signed)
Betty from [facility] left voicemail over lunch regarding the scheduling for the delivery of patient's medication. Phone number left to return call is [phone].

## 2018-03-15 ENCOUNTER — Telehealth: Payer: Self-pay | Admitting: Obstetrics and Gynecology

## 2018-03-15 NOTE — Telephone Encounter (Signed)
Call transferred from front office. Spoke with Geni Bers at [employer] Was advised patient has authorized shipment of Lupron 11.25 mg, dispense #1 to [facility] Calling to schedule delivery of medication. Lupron scheudled for delivery on 03/22/18.   Per review of telephone encounter dated 03/06/18, patient due for Lupron on 04/20/18 follow with PUS.

## 2018-03-15 NOTE — Telephone Encounter (Signed)
Left message to call Sharee Pimple, RN or Olivia Mackie, RN at [place].

## 2018-03-15 NOTE — Telephone Encounter (Signed)
Call placed to patient to scheduled recommended ultrasound, after 04/20/2018, which should be after second dose of lupron. Left voicemail message requesting a return call

## 2018-03-15 NOTE — Telephone Encounter (Signed)
Spoke with patient.   1. Scheduled nurse visit for 2nd Lupron Injection on 04/20/18 at 3:30pm   2. PUS scheduled for 04/27/18 at 0900, consult to follow at 0930, order previously placed.   Patient verbalizes understanding.  Routing to provider for final review. Patient is agreeable to disposition. Will close encounter.  Cc: Lerry Liner

## 2018-03-15 NOTE — Telephone Encounter (Signed)
See telephone encounter dated 03/14/18.   Spoke with patient.  1. Scheduled nurse visit for 2nd Lupron Injection on 04/20/18 at 3:30pm  2. PUS scheduled for 04/27/18 at 0900, consult to follow at 0930, order previously placed.   Encounter closed.

## 2018-04-12 DIAGNOSIS — D250 Submucous leiomyoma of uterus: Secondary | ICD-10-CM | POA: Diagnosis not present

## 2018-04-12 DIAGNOSIS — I10 Essential (primary) hypertension: Secondary | ICD-10-CM | POA: Diagnosis not present

## 2018-04-12 DIAGNOSIS — F3342 Major depressive disorder, recurrent, in full remission: Secondary | ICD-10-CM | POA: Diagnosis not present

## 2018-04-12 DIAGNOSIS — E6609 Other obesity due to excess calories: Secondary | ICD-10-CM | POA: Diagnosis not present

## 2018-04-20 ENCOUNTER — Ambulatory Visit (INDEPENDENT_AMBULATORY_CARE_PROVIDER_SITE_OTHER): Payer: BLUE CROSS/BLUE SHIELD | Admitting: Obstetrics and Gynecology

## 2018-04-20 ENCOUNTER — Ambulatory Visit: Payer: Self-pay

## 2018-04-20 ENCOUNTER — Other Ambulatory Visit: Payer: Self-pay

## 2018-04-20 VITALS — BP 120/80 | HR 76 | Wt 216.4 lb

## 2018-04-20 DIAGNOSIS — D259 Leiomyoma of uterus, unspecified: Secondary | ICD-10-CM | POA: Diagnosis not present

## 2018-04-20 DIAGNOSIS — Z79818 Long term (current) use of other agents affecting estrogen receptors and estrogen levels: Secondary | ICD-10-CM

## 2018-04-20 MED ORDER — LEUPROLIDE ACETATE (3 MONTH) 11.25 MG IM KIT
11.2500 mg | PACK | Freq: Once | INTRAMUSCULAR | Status: AC
  Administered 2018-04-20: 11.25 mg via INTRAMUSCULAR

## 2018-04-20 NOTE — Progress Notes (Signed)
43 y.o. Single African American female here for her second Depo Lupron injection. First injection given on 01/19/2018.  Indication for injection:  Fibroids  LMP:  04/07/2018, patient has had a tubal ligation  Contraception:  Tubal ligation  Lupron order:  11.14m IM x  3 dosages.  Injection given in LUOQ. Patient tolerated injection well.  Order to administer given by Dr. SQuincy Simmondson 12/22/2017. Dr.Jertson to review and sign chart today.

## 2018-04-26 ENCOUNTER — Other Ambulatory Visit: Payer: Self-pay

## 2018-04-27 ENCOUNTER — Ambulatory Visit (INDEPENDENT_AMBULATORY_CARE_PROVIDER_SITE_OTHER): Payer: BLUE CROSS/BLUE SHIELD

## 2018-04-27 ENCOUNTER — Ambulatory Visit: Payer: BLUE CROSS/BLUE SHIELD | Admitting: Obstetrics and Gynecology

## 2018-04-27 ENCOUNTER — Other Ambulatory Visit: Payer: Self-pay

## 2018-04-27 ENCOUNTER — Encounter: Payer: Self-pay | Admitting: Obstetrics and Gynecology

## 2018-04-27 VITALS — BP 130/86 | HR 80 | Temp 98.7°F | Ht 67.5 in | Wt 221.0 lb

## 2018-04-27 DIAGNOSIS — Z720 Tobacco use: Secondary | ICD-10-CM

## 2018-04-27 DIAGNOSIS — D219 Benign neoplasm of connective and other soft tissue, unspecified: Secondary | ICD-10-CM

## 2018-04-27 DIAGNOSIS — D259 Leiomyoma of uterus, unspecified: Secondary | ICD-10-CM | POA: Diagnosis not present

## 2018-04-27 MED ORDER — VARENICLINE TARTRATE 0.5 MG X 11 & 1 MG X 42 PO MISC: ORAL | 1 refills | Status: DC

## 2018-04-27 NOTE — Progress Notes (Signed)
GYNECOLOGY  VISIT   HPI: 43 y.o.   Single  African American  female   [id number] with Patient's last menstrual period was 04/07/2018.   here for Pelvic US follow up of fibroids.   She has enlarged uterus 18 week size with known large fibroids and a 10 cm degenerating fibroid. Has had CT, [place] and MRI.  EMB shoed benign proliferative endometrium.   Last Depo Lupron infection 11.25 mg on 04/20/18.   She is planning for future hysterectomy.   States she is not having pain.  After her first Depo Lupron she had a cycle for 3 weeks.  Menses returned March 27 with heavy bleeding for a few days but no pain.   Hot flashes are "terrible." Has night sweats.  There are unbearable but she declines tx for this.   She has mixed incontinence and has declined treatment for this.   Daily smoker.  Used Chantix in the past and it worked well.  GYNECOLOGIC HISTORY: Patient's last menstrual period was 04/07/2018. Contraception:  BTL Menopausal hormone therapy:  none Last mammogram:  10/26/17 [place] Right BIRADS3:Probably benign  Last pap smear:   09/06/13 Neg. HR HPV:neg         OB History    Gravida  3   Para  2   Term  2   Preterm      AB  1   Living        SAB      TAB  1   Ectopic      Multiple      Live Births                 Patient Active Problem List   Diagnosis Date Noted  . Class 1 obesity due to excess calories with serious comorbidity and body mass index (BMI) of 33.0 to 33.9 in adult 07/18/2017  . Normocytic anemia due to blood loss 05/10/2017  . Moderate major depression, single episode ([place]) 05/10/2017  . Hypertriglyceridemia without hypercholesterolemia 05/10/2017  . Leiomyoma of uterus, unspecified 09/06/2013  . BP (high blood pressure) 08/09/2013    Past Medical History:  Diagnosis Date  . Abnormal Pap smear of cervix 1994?  Marland Kitchen Anemia   . Anxiety   . Depression   . Fibroid   . Hypertension     Past Surgical History:  Procedure Laterality Date  .  COLPOSCOPY  1994?  . TUBAL LIGATION  06/2003    Current Outpatient Medications  Medication Sig Dispense Refill  . ibuprofen (ADVIL,MOTRIN) 600 MG tablet Take 1 tablet (600 mg total) by mouth every 6 (six) hours as needed. 30 tablet 0  . leuprolide (LUPRON DEPOT, 67-MONTH,) 11.25 MG injection Inject 11.25 mg into the muscle every 3 (three) months. 1 each 0  . losartan-hydrochlorothiazide (HYZAAR) 50-12.5 MG tablet Take by mouth.    . Multiple Vitamin (MULTIVITAMIN) tablet Take 1 tablet by mouth daily.     No current facility-administered medications for this visit.      ALLERGIES: Cephalexin and Lisinopril  Family History  Problem Relation Age of Onset  . Thyroid disease Mother   . Hypertension Father   . Hypertension Maternal Grandmother   . Diabetes Maternal Grandmother   . Hypertension Paternal Grandmother   . Diabetes Paternal Grandmother   . Breast cancer Paternal Grandmother   . Stroke Paternal Grandmother     Social History   Socioeconomic History  . Marital status: Single    Spouse name: Not on file  .  Number of children: Not on file  . Years of education: Not on file  . Highest education level: Not on file  Occupational History  . Not on file  Social Needs  . Financial resource strain: Not on file  . Food insecurity:    Worry: Not on file    Inability: Not on file  . Transportation needs:    Medical: Not on file    Non-medical: Not on file  Tobacco Use  . Smoking status: Current Every Day Smoker    Types: Cigars  . Smokeless tobacco: Never Used  Substance and Sexual Activity  . Alcohol use: Yes    Alcohol/week: 1.0 standard drinks    Types: 1 Standard drinks or equivalent per week  . Drug use: No  . Sexual activity: Yes    Partners: Male    Birth control/protection: Surgical    Comment: tubal ligation  Lifestyle  . Physical activity:    Days per week: Not on file    Minutes per session: Not on file  . Stress: Not on file  Relationships  . Social  connections:    Talks on phone: Not on file    Gets together: Not on file    Attends religious service: Not on file    Active member of club or organization: Not on file    Attends meetings of clubs or organizations: Not on file    Relationship status: Not on file  . Intimate partner violence:    Fear of current or ex partner: Not on file    Emotionally abused: Not on file    Physically abused: Not on file    Forced sexual activity: Not on file  Other Topics Concern  . Not on file  Social History Narrative  . Not on file    Review of Systems  All other systems reviewed and are negative.   PHYSICAL EXAMINATION:    BP 130/86   Pulse 80   Temp 98.7 F (37.1 C) (Oral)   Ht 5' 7.5" (1.715 m)   Wt 221 lb (100.2 kg)   LMP 04/07/2018 Comment: tubal ligation  BMI 34.10 kg/m     General appearance: alert, cooperative and appears stated age   Abdomen: soft, non-tender, uterus to 4 cm below umbilicus, mobile.   Pelvic US: Uterus with 3 fibroids - 7.56, 2.59. and 2.47 cm (reduced size). EMS 14.88 mm. Ovaries normal. No free fluid.   ASSESSMENT  Fibroid uterus, diminishing in size with Depo Lupron.  Tobacco use.    PLAN  We discussed there reduction in size of her uterus which will facilitate laparoscopic surgery.  We are still waiting for elective surgeries to be scheduled due to Covid 19.  If needed will do one additional month of Depo Lupron to bridge the gap to surgery. She will start Chantix for smoking cessation.  Fu in 2 months.    An After Visit Summary was printed and given to the patient.  __25____ minutes face to face time of which over 50% was spent in counseling.

## 2018-04-28 ENCOUNTER — Other Ambulatory Visit: Payer: BLUE CROSS/BLUE SHIELD

## 2018-05-16 DIAGNOSIS — R197 Diarrhea, unspecified: Secondary | ICD-10-CM | POA: Diagnosis not present

## 2018-05-16 DIAGNOSIS — R109 Unspecified abdominal pain: Secondary | ICD-10-CM | POA: Diagnosis not present

## 2018-05-16 DIAGNOSIS — J301 Allergic rhinitis due to pollen: Secondary | ICD-10-CM | POA: Diagnosis not present

## 2018-06-08 ENCOUNTER — Other Ambulatory Visit: Payer: BLUE CROSS/BLUE SHIELD

## 2018-06-11 ENCOUNTER — Other Ambulatory Visit: Payer: Self-pay | Admitting: Obstetrics and Gynecology

## 2018-06-12 DIAGNOSIS — A599 Trichomoniasis, unspecified: Secondary | ICD-10-CM

## 2018-06-12 HISTORY — DX: Trichomoniasis, unspecified: A59.9

## 2018-06-12 NOTE — Telephone Encounter (Signed)
Routed to triage

## 2018-06-12 NOTE — Telephone Encounter (Signed)
Patient received 2nd 11.25 mg Depo-Lupron injection on 04/20/18.   Seen in office on 04/27/18 for PUS for f/u of fibroids. Planning for future hysterectomy.   Routing to Dr. Quincy Simmonds to advise on Depo-Lupron Rx.

## 2018-06-12 NOTE — Telephone Encounter (Signed)
No further Depo Lupron is planned.   The next step in her care is anticipated laparoscopic hysterectomy.   Will forward to nursing supervisor.

## 2018-06-19 ENCOUNTER — Telehealth: Payer: Self-pay | Admitting: *Deleted

## 2018-06-19 NOTE — Telephone Encounter (Signed)
Do not refill. No further Lupron needed.

## 2018-06-19 NOTE — Telephone Encounter (Signed)
Detailed message left per DPR on mobile number for patient to return call to review Surgery Information Form and to schedule pre and post appointments. Advised could ask to speak to [facility] when patient returns call.

## 2018-06-20 ENCOUNTER — Other Ambulatory Visit: Payer: Self-pay | Admitting: *Deleted

## 2018-06-20 DIAGNOSIS — D219 Benign neoplasm of connective and other soft tissue, unspecified: Secondary | ICD-10-CM

## 2018-06-20 NOTE — Telephone Encounter (Signed)
Call to patient. Surgery information form reviewed in detailed with patient and she verbalized understanding. Aware she will receive a copy when she comes in for her PUS/pre op appointment on 06-22-2018. Patient has PAT with [facility] scheduled for 06-21-2018. Patient also aware she will have separate appointment for pre op COVID screening. Post op appointments scheduled for 07-10-2018 and 08-15-2018. Patient agreeable to date and time of all appointments.   Routing to provider and will close encounter.   CC Lamont Snowball, RN

## 2018-06-20 NOTE — Patient Instructions (Signed)
Kara Morales      Your procedure is scheduled on 07-03-2018   Report to [facility]  at  [place]   Call this number if you have problems the morning of surgery:[phone]  OUR ADDRESS IS [address], WE ARE LOCATED IN THE MEDICAL PLAZA WITH [facility].   Remember:  Do not eat food or drink liquids after midnight.  Take these medicines the morning of surgery with A SIP OF WATER: varenicline (chantix)   Do not wear jewelry, make-up or nail polish.  Do not wear lotions, powders, or perfumes, or deoderant.  Do not shave 48 hours prior to surgery.  Men may shave face and neck.  Do not bring valuables to the hospital.  [facility] is not responsible for any belongings or valuables.  Contacts, dentures or bridgework may not be worn into surgery.  Leave your suitcase in the car.  After surgery it may be brought to your room.  For patients admitted to the hospital, discharge time will be determined by your treatment team.  Patients discharged the day of surgery will not be allowed to drive home.   Special instructions:  YOU ARE SPENDING THE NIGHT PLEASE BRING ALL YOUR PRESCRIPTION MEDICATIONS IN THEIR ORIGINAL CONTAINERS. NO VISITORS ARE ALLOWED TO SPEND THE NIGHT.  Please read over the following fact sheets that you were given:   [facility] - Preparing for Surgery Before surgery, you can play an important role.  Because skin is not sterile, your skin needs to be as free of germs as possible.  You can reduce the number of germs on your skin by washing with CHG (chlorahexidine gluconate) soap before surgery.  CHG is an antiseptic cleaner which kills germs and bonds with the skin to continue killing germs even after washing. Please DO NOT use if you have an allergy to CHG or antibacterial soaps.  If your skin becomes reddened/irritated stop using the CHG and inform your nurse when you arrive at Short Stay. Do not shave (including legs and underarms)  for at least 48 hours prior to the first CHG shower.  You may shave your face/neck. Please follow these instructions carefully:  1.  Shower with CHG Soap the night before surgery and the  morning of Surgery.  2.  If you choose to wash your hair, wash your hair first as usual with your  normal  shampoo.  3.  After you shampoo, rinse your hair and body thoroughly to remove the  shampoo.                           4.  Use CHG as you would any other liquid soap.  You can apply chg directly  to the skin and wash                       Gently with a scrungie or clean washcloth.  5.  Apply the CHG Soap to your body ONLY FROM THE NECK DOWN.   Do not use on face/ open                           Wound or open sores. Avoid contact with eyes, ears mouth and genitals (private parts).                       Wash  face,  Genitals (private parts) with your normal soap.             6.  Wash thoroughly, paying special attention to the area where your surgery  will be performed.  7.  Thoroughly rinse your body with warm water from the neck down.  8.  DO NOT shower/wash with your normal soap after using and rinsing off  the CHG Soap.                9.  Pat yourself dry with a clean towel.            10.  Wear clean pajamas.            11.  Place clean sheets on your bed the night of your first shower and do not  sleep with pets. Day of Surgery : Do not apply any lotions/deodorants the morning of surgery.  Please wear clean clothes to the hospital/surgery center.  FAILURE TO FOLLOW THESE INSTRUCTIONS MAY RESULT IN THE CANCELLATION OF YOUR SURGERY PATIENT SIGNATURE_________________________________  NURSE SIGNATURE__________________________________  ________________________________________________________________________

## 2018-06-21 ENCOUNTER — Encounter (HOSPITAL_COMMUNITY): Payer: Self-pay

## 2018-06-21 ENCOUNTER — Encounter (HOSPITAL_COMMUNITY)
Admission: RE | Admit: 2018-06-21 | Discharge: 2018-06-21 | Disposition: A | Discharge status: Home / Self Care | Payer: BC Managed Care – PPO | Source: Ambulatory Visit | Attending: Obstetrics and Gynecology | Admitting: Obstetrics and Gynecology

## 2018-06-21 ENCOUNTER — Other Ambulatory Visit: Payer: Self-pay

## 2018-06-21 DIAGNOSIS — Z01818 Encounter for other preprocedural examination: Secondary | ICD-10-CM | POA: Diagnosis not present

## 2018-06-21 DIAGNOSIS — I10 Essential (primary) hypertension: Secondary | ICD-10-CM | POA: Diagnosis not present

## 2018-06-21 DIAGNOSIS — D259 Leiomyoma of uterus, unspecified: Secondary | ICD-10-CM | POA: Diagnosis not present

## 2018-06-21 DIAGNOSIS — R9431 Abnormal electrocardiogram [ECG] [EKG]: Secondary | ICD-10-CM | POA: Diagnosis not present

## 2018-06-21 LAB — CBC
HCT: 37.1 % (ref 36.0–46.0)
Hemoglobin: 12 g/dL (ref 12.0–15.0)
MCH: 28 pg (ref 26.0–34.0)
MCHC: 32.3 g/dL (ref 30.0–36.0)
MCV: 86.5 fL (ref 80.0–100.0)
Platelets: 247 10*3/uL (ref 150–400)
RBC: 4.29 MIL/uL (ref 3.87–5.11)
RDW: 16.1 % — ABNORMAL HIGH (ref 11.5–15.5)
WBC: 5 10*3/uL (ref 4.0–10.5)
nRBC: 0 % (ref 0.0–0.2)

## 2018-06-21 LAB — BASIC METABOLIC PANEL
Anion gap: 8 (ref 5–15)
BUN: 9 mg/dL (ref 6–20)
CO2: 27 mmol/L (ref 22–32)
Calcium: 9.5 mg/dL (ref 8.9–10.3)
Chloride: 103 mmol/L (ref 98–111)
Creatinine, Ser: 0.6 mg/dL (ref 0.44–1.00)
GFR calc Af Amer: >60 mL/min (ref >60)
GFR calc non Af Amer: >60 mL/min (ref >60)
Glucose, Bld: 84 mg/dL (ref 70–99)
Potassium: 3.6 mmol/L (ref 3.5–5.1)
Sodium: 138 mmol/L (ref 135–145)

## 2018-06-21 LAB — ABO/RH: ABO/RH(D): A POS

## 2018-06-21 NOTE — Progress Notes (Signed)
Patient's BP was elevated at PAT visit diastolic over 888 x 3. In both arms. Pt had taken her BP medication in AM. Konrad Felix PA was made aware and she advised that Pt see her PCP today.

## 2018-06-22 ENCOUNTER — Ambulatory Visit (INDEPENDENT_AMBULATORY_CARE_PROVIDER_SITE_OTHER): Payer: BC Managed Care – PPO | Admitting: Obstetrics and Gynecology

## 2018-06-22 ENCOUNTER — Other Ambulatory Visit (HOSPITAL_COMMUNITY)
Admission: RE | Admit: 2018-06-22 | Discharge: 2018-06-22 | Disposition: A | Discharge status: Home / Self Care | Payer: BC Managed Care – PPO | Source: Ambulatory Visit | Attending: Obstetrics and Gynecology | Admitting: Obstetrics and Gynecology

## 2018-06-22 ENCOUNTER — Ambulatory Visit (INDEPENDENT_AMBULATORY_CARE_PROVIDER_SITE_OTHER): Payer: BC Managed Care – PPO

## 2018-06-22 ENCOUNTER — Encounter: Payer: Self-pay | Admitting: Obstetrics and Gynecology

## 2018-06-22 VITALS — BP 138/80 | HR 72 | Resp 12 | Ht 67.5 in | Wt 223.0 lb

## 2018-06-22 DIAGNOSIS — N764 Abscess of vulva: Secondary | ICD-10-CM

## 2018-06-22 DIAGNOSIS — D219 Benign neoplasm of connective and other soft tissue, unspecified: Secondary | ICD-10-CM | POA: Diagnosis not present

## 2018-06-22 DIAGNOSIS — Z124 Encounter for screening for malignant neoplasm of cervix: Secondary | ICD-10-CM | POA: Insufficient documentation

## 2018-06-22 DIAGNOSIS — Z0289 Encounter for other administrative examinations: Secondary | ICD-10-CM

## 2018-06-22 DIAGNOSIS — Z113 Encounter for screening for infections with a predominantly sexual mode of transmission: Secondary | ICD-10-CM

## 2018-06-22 MED ORDER — SULFAMETHOXAZOLE-TRIMETHOPRIM 800-160 MG PO TABS: 1.0000 | ORAL_TABLET | Freq: Two times a day (BID) | ORAL | 0 refills | Status: DC

## 2018-06-22 NOTE — Progress Notes (Signed)
GYNECOLOGY  VISIT   HPI: 43 y.o.   Single  African American  female   [id number] with Patient's last menstrual period was 05/01/2018.   here for  Pelvic ultrasound for recheck of fibroids.   She desires hysterectomy due to her fibroids, and she declines future childbearing.  She has a history of painful and heavy menstruation and anemia.  She desires STD screening today.  She presented with an 18 week size uterus and a degenerating fibroid.  She has had pelvic US, CT and MRI.  EMB showed benign proliferative endometrium.   She has been treated with Depo Lupron for fibroid shrinkage.  She is having hot flashes.   Her last Hgb was 12.0 on 06/21/18.  She has mixed incontinence and has declined tx for this.   She is a smoker and received Rx for Chantix.  Has not smoked for 3 weeks.   She has FMLA papers to be filled out.   She states no changes in her health.   Her boyfriend will care for her at home.  They live together.  Her mother will also be a support person.   GYNECOLOGIC HISTORY: Patient's last menstrual period was 05/01/2018. Contraception:  BTL Menopausal hormone therapy:  none Last mammogram:  10/26/17 [place] Right BIRADS3:Probably benign Last pap smear:    09/06/13 Neg. HR HPV:neg         OB History    Gravida  3   Para  2   Term  2   Preterm      AB  1   Living        SAB      TAB  1   Ectopic      Multiple      Live Births                 Patient Active Problem List   Diagnosis Date Noted  . Class 1 obesity due to excess calories with serious comorbidity and body mass index (BMI) of 33.0 to 33.9 in adult 07/18/2017  . Normocytic anemia due to blood loss 05/10/2017  . Moderate major depression, single episode ([place]) 05/10/2017  . Hypertriglyceridemia without hypercholesterolemia 05/10/2017  . Leiomyoma of uterus, unspecified 09/06/2013  . BP (high blood pressure) 08/09/2013    Past Medical History:  Diagnosis Date  . Abnormal Pap smear  of cervix 1994?  Marland Kitchen Anemia   . Anxiety   . Depression   . Fibroid   . Hypertension 2015    Past Surgical History:  Procedure Laterality Date  . COLPOSCOPY  1994?  . TUBAL LIGATION  06/2003    Current Outpatient Medications  Medication Sig Dispense Refill  . ibuprofen (ADVIL,MOTRIN) 600 MG tablet Take 1 tablet (600 mg total) by mouth every 6 (six) hours as needed. 30 tablet 0  . losartan-hydrochlorothiazide (HYZAAR) 50-12.5 MG tablet Take 1 tablet by mouth daily.     . Multiple Vitamin (MULTIVITAMIN) tablet Take 1 tablet by mouth daily.    . varenicline (CHANTIX STARTING MONTH PAK) 0.5 MG X 11 & 1 MG X 42 tablet Take one 0.5 mg tablet by mouth once daily for 3 days, then increase to one 0.5 mg tablet twice daily for 4 days, then increase to one 1 mg tablet twice daily.  Please do refill of maintenance dose of 1 mg daily, #56 tablets. (Patient taking differently: Take 1 mg by mouth 2 (two) times daily. ) 53 tablet 1   No current facility-administered medications for  this visit.      ALLERGIES: Cephalexin and Lisinopril  Family History  Problem Relation Age of Onset  . Thyroid disease Mother   . Hypertension Father   . Hypertension Maternal Grandmother   . Diabetes Maternal Grandmother   . Hypertension Paternal Grandmother   . Diabetes Paternal Grandmother   . Breast cancer Paternal Grandmother   . Stroke Paternal Grandmother     Social History   Socioeconomic History  . Marital status: Single    Spouse name: Not on file  . Number of children: Not on file  . Years of education: Not on file  . Highest education level: Not on file  Occupational History  . Not on file  Social Needs  . Financial resource strain: Not on file  . Food insecurity    Worry: Not on file    Inability: Not on file  . Transportation needs    Medical: Not on file    Non-medical: Not on file  Tobacco Use  . Smoking status: Current Every Day Smoker    Types: Cigars  . Smokeless tobacco: Never  Used  . Tobacco comment: pt stopped 06/10/18  Substance and Sexual Activity  . Alcohol use: Yes    Alcohol/week: 1.0 standard drinks    Types: 1 Standard drinks or equivalent per week  . Drug use: No  . Sexual activity: Yes    Partners: Male    Birth control/protection: Surgical    Comment: tubal ligation  Lifestyle  . Physical activity    Days per week: Not on file    Minutes per session: Not on file  . Stress: Not on file  Relationships  . Social Herbalist on phone: Not on file    Gets together: Not on file    Attends religious service: Not on file    Active member of club or organization: Not on file    Attends meetings of clubs or organizations: Not on file    Relationship status: Not on file  . Intimate partner violence    Fear of current or ex partner: Not on file    Emotionally abused: Not on file    Physically abused: Not on file    Forced sexual activity: Not on file  Other Topics Concern  . Not on file  Social History Narrative  . Not on file    Review of Systems  Constitutional: Negative.   HENT: Negative.   Eyes: Negative.   Respiratory: Negative.   Cardiovascular: Negative.   Gastrointestinal: Negative.   Endocrine: Negative.   Genitourinary: Negative.   Musculoskeletal: Negative.   Skin: Negative.   Allergic/Immunologic: Negative.   Neurological: Negative.   Hematological: Negative.   Psychiatric/Behavioral: Negative.     PHYSICAL EXAMINATION:    BP 138/80 (BP Location: Left Arm, Patient Position: Sitting, Cuff Size: Large)   Pulse 72   Resp 12   Ht 5' 7.5" (1.715 m)   Wt 223 lb (101.2 kg)   LMP 05/01/2018   BMI 34.41 kg/m     General appearance: alert, cooperative and appears stated age Head: Normocephalic, without obvious abnormality, atraumatic Neck: no adenopathy, supple, symmetrical, trachea midline and thyroid normal to inspection and palpation Lungs: clear to auscultation bilaterally Heart: regular rate and  rhythm Abdomen: soft, non-tender, no masses,  no organomegaly Extremities: extremities normal, atraumatic, no cyanosis or edema Skin: Skin color, texture, turgor normal. Two small abscesses of her right lateral vulva and her right medial thigh.  Each 7 - 9 mm and nondraining. (States these are recurrent.) Lymph nodes: Cervical, supraclavicular, and axillary nodes normal. No abnormal inguinal nodes palpated Neurologic: Grossly normal  Pelvic: External genitalia:  no lesions              Urethra:  normal appearing urethra with no masses, tenderness or lesions              Bartholins and Skenes: normal                 Vagina: normal appearing vagina with normal color and discharge, no lesions              Cervix: no lesions.  Pap taken.                 Bimanual Exam:  Uterus:   16 week size and mobile, does not fill pelvis, and is nontender.               Adnexa: no mass, fullness, tenderness              Rectal exam: Yes.  .  Confirms.              Anus:  normal sphincter tone, no lesions  Chaperone was present for exam.  Pelvic US: Uterus with multiple fibroids, 7.05 cm, 2.20 cm, 2.86 cm, 0.92 cm.  EMS 1.78 mm. Ovaries normal.  No free fluid.   ASSESSMENT  Cervical cancer screening.  STD testing.  Vulvar abscess. Recurrent. Fibroids.  Status post Depo Lupron x 6 months.  Mixed incontinence.    PLAN  Bactrim DS po bid x 7 days.  Unable to do wound culture.  STD screening.  Pap and HR HPV.  Surgical care discussed.  I discussed total laparoscopic hysterectomy with bilateral salpingectomy and possible bilateral oophorectomy, vaginal morcellation of uterus in Alexis bag, and cystoscopy.   Will mainitain her ovaries if normal.  I reviewed risks, benefits, and alternatives to above surgery.  Risks include but are not limited to bleeding, infection, damage to surrounding organs, pneumonia, reaction to anesthesia, DVT, PE, death, need for reoperation, hernia formation, vaginal  cuff dehiscence, neuropathy, and need to convert to a traditional laparotomy incision to complete the procedure.  Surgical expectations and recovery discussed. Patient wishes to proceed.  She will receive Lovenox for DVT/PE prophylaxis. Cipro and Flagyl for surgical abx prophylaxis.   Questions invited and answered.   An After Visit Summary was printed and given to the patient.  __25____ minutes face to face time of which over 50% was spent in counseling.

## 2018-06-22 NOTE — Progress Notes (Signed)
Encounter reviewed by Dr. Aundria Rud.

## 2018-06-23 LAB — HEP, RPR, HIV PANEL
HIV Screen 4th Generation wRfx: NONREACTIVE
Hepatitis B Surface Ag: NEGATIVE
RPR Ser Ql: NONREACTIVE

## 2018-06-23 LAB — HEPATITIS C ANTIBODY: Hep C Virus Ab: <0.1 s/co ratio (ref 0.0–0.9)

## 2018-06-27 DIAGNOSIS — I10 Essential (primary) hypertension: Secondary | ICD-10-CM | POA: Diagnosis not present

## 2018-06-27 LAB — CYTOLOGY - PAP
Chlamydia: NEGATIVE
Diagnosis: NEGATIVE
HPV: NOT DETECTED
Neisseria Gonorrhea: NEGATIVE
Trichomonas: POSITIVE — AB

## 2018-06-28 ENCOUNTER — Other Ambulatory Visit: Payer: Self-pay | Admitting: *Deleted

## 2018-06-28 ENCOUNTER — Encounter (HOSPITAL_BASED_OUTPATIENT_CLINIC_OR_DEPARTMENT_OTHER): Payer: Self-pay | Admitting: Obstetrics and Gynecology

## 2018-06-28 MED ORDER — METRONIDAZOLE 500 MG PO TABS: 2000.0000 mg | ORAL_TABLET | Freq: Once | ORAL | 0 refills | Status: AC

## 2018-06-29 ENCOUNTER — Other Ambulatory Visit (HOSPITAL_COMMUNITY): Payer: BC Managed Care – PPO

## 2018-06-29 ENCOUNTER — Other Ambulatory Visit (HOSPITAL_COMMUNITY)
Admission: RE | Admit: 2018-06-29 | Discharge: 2018-06-29 | Disposition: A | Discharge status: Home / Self Care | Payer: BC Managed Care – PPO | Source: Ambulatory Visit | Attending: Obstetrics and Gynecology | Admitting: Obstetrics and Gynecology

## 2018-06-29 DIAGNOSIS — Z1159 Encounter for screening for other viral diseases: Secondary | ICD-10-CM | POA: Diagnosis not present

## 2018-06-29 LAB — SARS CORONAVIRUS 2 (TAT 6-24 HRS): SARS Coronavirus 2: NEGATIVE

## 2018-07-02 NOTE — H&P (Signed)
Office Visit  06/22/2018 [place] Silva, Everardo All, MD Obstetrics and Gynecology  Fibroids +3 more Dx  Office Visit   ; Referred by Mindi Curling, PA-C Reason for Visit  Additional Documentation  Vitals:    BP 138/80 (BP Location: Left Arm, Patient Position: Sitting, Cuff Size: Large)  Pulse 72  Resp 12  Ht 5' 7.5" (1.715 m)  Wt 101.2 kg  LMP 05/01/2018  BMI 34.41 kg/m  BSA 2.2 m    More Vitals  Flowsheets:    Anthropometrics,  MEWS Score,  Method of Visit    Encounter Info:   Billing Info,  History,  Allergies,  Detailed Report    All Notes   Progress Notes by Nunzio Cobbs, MD at 06/22/2018 8:30 AM Author: Nunzio Cobbs, MD Author Type: Physician Filed: 06/25/2018  4:58 PM  Note Status: Signed Cosign: Cosign Not Required Encounter Date: 06/22/2018  Editor: Nunzio Cobbs, MD (Physician)  Prior Versions: 1. Nunzio Cobbs, MD (Physician) at 06/22/2018  8:50 AM - Sign when Signing Visit   2. Orion Crook, RDMS (Technician) at 06/22/2018  8:43 AM - Sign when Signing Visit    Encounter reviewed by Dr. Aundria Rud.                Progress Notes by Nunzio Cobbs, MD at 06/22/2018 8:30 AM Author: Nunzio Cobbs, MD Author Type: Physician Filed: 06/25/2018  4:58 PM  Note Status: Signed Cosign: Cosign Not Required Encounter Date: 06/22/2018  Editor: Nunzio Cobbs, MD (Physician)  Prior Versions: 1. Nunzio Cobbs, MD (Physician) at 06/22/2018  9:25 AM - Sign when Signing Visit   2. Archie Balboa CMA (Certified Psychologist, sport and exercise) at 06/22/2018  8:44 AM - Sign when Signing Visit    GYNECOLOGY  VISIT   HPI: 43 y.o.   Single  African American  female   [id number] with Patient's last menstrual period was 05/01/2018.   here for  Pelvic ultrasound for recheck of fibroids.    She desires hysterectomy due to her  fibroids, and she declines future childbearing.  She has a history of painful and heavy menstruation and anemia.   She desires STD screening today.   She presented with an 18 week size uterus and a degenerating fibroid.  She has had pelvic US, CT and MRI.  EMB showed benign proliferative endometrium.    She has been treated with Depo Lupron for fibroid shrinkage.  She is having hot flashes.    Her last Hgb was 12.0 on 06/21/18.   She has mixed incontinence and has declined tx for this.    She is a smoker and received Rx for Chantix.  Has not smoked for 3 weeks.    She has FMLA papers to be filled out.    She states no changes in her health.    Her boyfriend will care for her at home.  They live together.  Her mother will also be a support person.    GYNECOLOGIC HISTORY: Patient's last menstrual period was 05/01/2018. Contraception:  BTL Menopausal hormone therapy:  none Last mammogram:  10/26/17 [place] Right BIRADS3:Probably benign Last pap smear:    09/06/13 Neg. HR HPV:neg         OB History     Gravida  3   Para  2   Term  2   Preterm      AB  1   Living         SAB      TAB  1   Ectopic      Multiple      Live Births                        Patient Active Problem List    Diagnosis Date Noted  . Class 1 obesity due to excess calories with serious comorbidity and body mass index (BMI) of 33.0 to 33.9 in adult 07/18/2017  . Normocytic anemia due to blood loss 05/10/2017  . Moderate major depression, single episode ([place]) 05/10/2017  . Hypertriglyceridemia without hypercholesterolemia 05/10/2017  . Leiomyoma of uterus, unspecified 09/06/2013  . BP (high blood pressure) 08/09/2013         Past Medical History:  Diagnosis Date  . Abnormal Pap smear of cervix 1994?  Marland Kitchen Anemia    . Anxiety    . Depression    . Fibroid    . Hypertension 2015          Past Surgical History:  Procedure Laterality Date  . COLPOSCOPY   1994?  . TUBAL LIGATION    06/2003           Current Outpatient Medications  Medication Sig Dispense Refill  . ibuprofen (ADVIL,MOTRIN) 600 MG tablet Take 1 tablet (600 mg total) by mouth every 6 (six) hours as needed. 30 tablet 0  . losartan-hydrochlorothiazide (HYZAAR) 50-12.5 MG tablet Take 1 tablet by mouth daily.       . Multiple Vitamin (MULTIVITAMIN) tablet Take 1 tablet by mouth daily.      . varenicline (CHANTIX STARTING MONTH PAK) 0.5 MG X 11 & 1 MG X 42 tablet Take one 0.5 mg tablet by mouth once daily for 3 days, then increase to one 0.5 mg tablet twice daily for 4 days, then increase to one 1 mg tablet twice daily.  Please do refill of maintenance dose of 1 mg daily, #56 tablets. (Patient taking differently: Take 1 mg by mouth 2 (two) times daily. ) 53 tablet 1    No current facility-administered medications for this visit.       ALLERGIES: Cephalexin and Lisinopril        Family History  Problem Relation Age of Onset  . Thyroid disease Mother    . Hypertension Father    . Hypertension Maternal Grandmother    . Diabetes Maternal Grandmother    . Hypertension Paternal Grandmother    . Diabetes Paternal Grandmother    . Breast cancer Paternal Grandmother    . Stroke Paternal Grandmother       Social History         Socioeconomic History  . Marital status: Single      Spouse name: Not on file  . Number of children: Not on file  . Years of education: Not on file  . Highest education level: Not on file  Occupational History  . Not on file  Social Needs  . Financial resource strain: Not on file  . Food insecurity      Worry: Not on file      Inability: Not on file  . Transportation needs      Medical: Not on file      Non-medical: Not on file  Tobacco Use  . Smoking status: Current Every Day Smoker      Types:  Cigars  . Smokeless tobacco: Never Used  . Tobacco comment: pt stopped 06/10/18  Substance and Sexual Activity  . Alcohol use: Yes      Alcohol/week: 1.0 standard drinks       Types: 1 Standard drinks or equivalent per week  . Drug use: No  . Sexual activity: Yes      Partners: Male      Birth control/protection: Surgical      Comment: tubal ligation  Lifestyle  . Physical activity      Days per week: Not on file      Minutes per session: Not on file  . Stress: Not on file  Relationships  . Social Chief Strategy Officer on phone: Not on file      Gets together: Not on file      Attends religious service: Not on file      Active member of club or organization: Not on file      Attends meetings of clubs or organizations: Not on file      Relationship status: Not on file  . Intimate partner violence      Fear of current or ex partner: Not on file      Emotionally abused: Not on file      Physically abused: Not on file      Forced sexual activity: Not on file  Other Topics Concern  . Not on file  Social History Narrative  . Not on file     Review of Systems  Constitutional: Negative.   HENT: Negative.   Eyes: Negative.   Respiratory: Negative.   Cardiovascular: Negative.   Gastrointestinal: Negative.   Endocrine: Negative.   Genitourinary: Negative.   Musculoskeletal: Negative.   Skin: Negative.   Allergic/Immunologic: Negative.   Neurological: Negative.   Hematological: Negative.   Psychiatric/Behavioral: Negative.       PHYSICAL EXAMINATION:     BP 138/80 (BP Location: Left Arm, Patient Position: Sitting, Cuff Size: Large)   Pulse 72   Resp 12   Ht 5' 7.5" (1.715 m)   Wt 223 lb (101.2 kg)   LMP 05/01/2018   BMI 34.41 kg/m     General appearance: alert, cooperative and appears stated age Head: Normocephalic, without obvious abnormality, atraumatic Neck: no adenopathy, supple, symmetrical, trachea midline and thyroid normal to inspection and palpation Lungs: clear to auscultation bilaterally Heart: regular rate and rhythm Abdomen: soft, non-tender, no masses,  no organomegaly Extremities: extremities normal, atraumatic, no  cyanosis or edema Skin: Skin color, texture, turgor normal. Two small abscesses of her right lateral vulva and her right medial thigh.  Each 7 - 9 mm and nondraining. (States these are recurrent.) Lymph nodes: Cervical, supraclavicular, and axillary nodes normal. No abnormal inguinal nodes palpated Neurologic: Grossly normal   Pelvic: External genitalia:  no lesions              Urethra:  normal appearing urethra with no masses, tenderness or lesions              Bartholins and Skenes: normal                 Vagina: normal appearing vagina with normal color and discharge, no lesions              Cervix: no lesions.  Pap taken.                 Bimanual Exam:  Uterus:   16 week  size and mobile, does not fill pelvis, and is nontender.               Adnexa: no mass, fullness, tenderness              Rectal exam: Yes.  .  Confirms.              Anus:  normal sphincter tone, no lesions   Chaperone was present for exam.   Pelvic US: Uterus with multiple fibroids, 7.05 cm, 2.20 cm, 2.86 cm, 0.92 cm.  EMS 1.78 mm. Ovaries normal.  No free fluid.    ASSESSMENT   Cervical cancer screening.  STD testing.  Vulvar abscess. Recurrent. Fibroids.  Status post Depo Lupron x 6 months.  Mixed incontinence.     PLAN   Bactrim DS po bid x 7 days.  Unable to do wound culture.  STD screening.  Pap and HR HPV.  Surgical care discussed.  I discussed total laparoscopic hysterectomy with bilateral salpingectomy and possible bilateral oophorectomy, vaginal morcellation of uterus in Alexis bag, and cystoscopy.   Will mainitain her ovaries if normal.   I reviewed risks, benefits, and alternatives to above surgery.  Risks include but are not limited to bleeding, infection, damage to surrounding organs, pneumonia, reaction to anesthesia, DVT, PE, death, need for reoperation, hernia formation, vaginal cuff dehiscence, neuropathy, and need to convert to a traditional laparotomy incision to complete the  procedure.  Surgical expectations and recovery discussed. Patient wishes to proceed.   She will receive Lovenox for DVT/PE prophylaxis. Cipro and Flagyl for surgical abx prophylaxis.    Questions invited and answered.   An After Visit Summary was printed and given to the patient.   __25____ minutes face to face time of which over 50% was spent in counseling.

## 2018-07-03 ENCOUNTER — Observation Stay (HOSPITAL_BASED_OUTPATIENT_CLINIC_OR_DEPARTMENT_OTHER)
Admission: RE | Admit: 2018-07-03 | Discharge: 2018-07-03 | Disposition: A | Discharge status: Home / Self Care | Payer: BC Managed Care – PPO | Attending: Obstetrics and Gynecology | Admitting: Obstetrics and Gynecology

## 2018-07-03 ENCOUNTER — Encounter (HOSPITAL_BASED_OUTPATIENT_CLINIC_OR_DEPARTMENT_OTHER)
Admission: RE | Disposition: A | Discharge status: Home / Self Care | Payer: Self-pay | Source: Home / Self Care | Attending: Obstetrics and Gynecology

## 2018-07-03 ENCOUNTER — Observation Stay (HOSPITAL_BASED_OUTPATIENT_CLINIC_OR_DEPARTMENT_OTHER): Payer: BC Managed Care – PPO | Admitting: Anesthesiology

## 2018-07-03 ENCOUNTER — Encounter (HOSPITAL_BASED_OUTPATIENT_CLINIC_OR_DEPARTMENT_OTHER): Payer: Self-pay

## 2018-07-03 ENCOUNTER — Observation Stay (HOSPITAL_BASED_OUTPATIENT_CLINIC_OR_DEPARTMENT_OTHER): Payer: BC Managed Care – PPO | Admitting: Physician Assistant

## 2018-07-03 ENCOUNTER — Other Ambulatory Visit: Payer: Self-pay

## 2018-07-03 DIAGNOSIS — Z8249 Family history of ischemic heart disease and other diseases of the circulatory system: Secondary | ICD-10-CM | POA: Insufficient documentation

## 2018-07-03 DIAGNOSIS — F17290 Nicotine dependence, other tobacco product, uncomplicated: Secondary | ICD-10-CM | POA: Insufficient documentation

## 2018-07-03 DIAGNOSIS — Z6834 Body mass index (BMI) 34.0-34.9, adult: Secondary | ICD-10-CM | POA: Diagnosis not present

## 2018-07-03 DIAGNOSIS — Z8349 Family history of other endocrine, nutritional and metabolic diseases: Secondary | ICD-10-CM | POA: Insufficient documentation

## 2018-07-03 DIAGNOSIS — E7800 Pure hypercholesterolemia, unspecified: Secondary | ICD-10-CM | POA: Diagnosis not present

## 2018-07-03 DIAGNOSIS — E669 Obesity, unspecified: Secondary | ICD-10-CM | POA: Diagnosis not present

## 2018-07-03 DIAGNOSIS — E781 Pure hyperglyceridemia: Secondary | ICD-10-CM | POA: Diagnosis not present

## 2018-07-03 DIAGNOSIS — N920 Excessive and frequent menstruation with regular cycle: Secondary | ICD-10-CM | POA: Insufficient documentation

## 2018-07-03 DIAGNOSIS — R9389 Abnormal findings on diagnostic imaging of other specified body structures: Secondary | ICD-10-CM | POA: Diagnosis not present

## 2018-07-03 DIAGNOSIS — F329 Major depressive disorder, single episode, unspecified: Secondary | ICD-10-CM | POA: Diagnosis not present

## 2018-07-03 DIAGNOSIS — N3946 Mixed incontinence: Secondary | ICD-10-CM | POA: Diagnosis not present

## 2018-07-03 DIAGNOSIS — D649 Anemia, unspecified: Secondary | ICD-10-CM | POA: Diagnosis not present

## 2018-07-03 DIAGNOSIS — N838 Other noninflammatory disorders of ovary, fallopian tube and broad ligament: Secondary | ICD-10-CM | POA: Diagnosis not present

## 2018-07-03 DIAGNOSIS — D219 Benign neoplasm of connective and other soft tissue, unspecified: Secondary | ICD-10-CM | POA: Diagnosis present

## 2018-07-03 DIAGNOSIS — F419 Anxiety disorder, unspecified: Secondary | ICD-10-CM | POA: Diagnosis not present

## 2018-07-03 DIAGNOSIS — Z79899 Other long term (current) drug therapy: Secondary | ICD-10-CM | POA: Insufficient documentation

## 2018-07-03 DIAGNOSIS — Z823 Family history of stroke: Secondary | ICD-10-CM | POA: Insufficient documentation

## 2018-07-03 DIAGNOSIS — N946 Dysmenorrhea, unspecified: Secondary | ICD-10-CM | POA: Diagnosis not present

## 2018-07-03 DIAGNOSIS — D259 Leiomyoma of uterus, unspecified: Secondary | ICD-10-CM | POA: Diagnosis not present

## 2018-07-03 DIAGNOSIS — Z888 Allergy status to other drugs, medicaments and biological substances status: Secondary | ICD-10-CM | POA: Diagnosis not present

## 2018-07-03 DIAGNOSIS — Z833 Family history of diabetes mellitus: Secondary | ICD-10-CM | POA: Diagnosis not present

## 2018-07-03 DIAGNOSIS — Z791 Long term (current) use of non-steroidal anti-inflammatories (NSAID): Secondary | ICD-10-CM | POA: Insufficient documentation

## 2018-07-03 DIAGNOSIS — I10 Essential (primary) hypertension: Secondary | ICD-10-CM | POA: Diagnosis not present

## 2018-07-03 DIAGNOSIS — Z803 Family history of malignant neoplasm of breast: Secondary | ICD-10-CM | POA: Diagnosis not present

## 2018-07-03 DIAGNOSIS — N764 Abscess of vulva: Secondary | ICD-10-CM | POA: Insufficient documentation

## 2018-07-03 DIAGNOSIS — Z90710 Acquired absence of both cervix and uterus: Secondary | ICD-10-CM | POA: Diagnosis present

## 2018-07-03 DIAGNOSIS — Z881 Allergy status to other antibiotic agents status: Secondary | ICD-10-CM | POA: Insufficient documentation

## 2018-07-03 DIAGNOSIS — N921 Excessive and frequent menstruation with irregular cycle: Secondary | ICD-10-CM | POA: Diagnosis not present

## 2018-07-03 DIAGNOSIS — R030 Elevated blood-pressure reading, without diagnosis of hypertension: Secondary | ICD-10-CM | POA: Insufficient documentation

## 2018-07-03 DIAGNOSIS — R232 Flushing: Secondary | ICD-10-CM | POA: Insufficient documentation

## 2018-07-03 HISTORY — PX: CYSTOSCOPY: SHX5120

## 2018-07-03 HISTORY — PX: TOTAL LAPAROSCOPIC HYSTERECTOMY WITH SALPINGECTOMY: SHX6742

## 2018-07-03 LAB — TYPE AND SCREEN
ABO/RH(D): A POS
Antibody Screen: NEGATIVE

## 2018-07-03 LAB — POCT PREGNANCY, URINE: Preg Test, Ur: NEGATIVE

## 2018-07-03 LAB — CBC
HCT: 34.1 % — ABNORMAL LOW (ref 36.0–46.0)
Hemoglobin: 11.2 g/dL — ABNORMAL LOW (ref 12.0–15.0)
MCH: 28.6 pg (ref 26.0–34.0)
MCHC: 32.8 g/dL (ref 30.0–36.0)
MCV: 87.2 fL (ref 80.0–100.0)
Platelets: 239 10*3/uL (ref 150–400)
RBC: 3.91 MIL/uL (ref 3.87–5.11)
RDW: 15.9 % — ABNORMAL HIGH (ref 11.5–15.5)
WBC: 9.7 10*3/uL (ref 4.0–10.5)
nRBC: 0 % (ref 0.0–0.2)

## 2018-07-03 SURGERY — HYSTERECTOMY, TOTAL, LAPAROSCOPIC, WITH SALPINGECTOMY
Anesthesia: General | Site: Uterus

## 2018-07-03 MED ORDER — ONDANSETRON HCL 4 MG PO TABS: 4.0000 mg | ORAL_TABLET | Freq: Four times a day (QID) | ORAL | 0 refills | Status: DC | PRN

## 2018-07-03 MED ORDER — OXYCODONE HCL 5 MG PO TABS
ORAL_TABLET | ORAL | Status: AC
  Filled 2018-07-03: qty 1

## 2018-07-03 MED ORDER — HYDRALAZINE HCL 20 MG/ML IJ SOLN
INTRAMUSCULAR | Status: AC
  Filled 2018-07-03: qty 1

## 2018-07-03 MED ORDER — DEXAMETHASONE SODIUM PHOSPHATE 10 MG/ML IJ SOLN
INTRAMUSCULAR | Status: DC | PRN
  Administered 2018-07-03: 5 mg via INTRAVENOUS

## 2018-07-03 MED ORDER — MIDAZOLAM HCL 2 MG/2ML IJ SOLN
INTRAMUSCULAR | Status: AC
  Filled 2018-07-03: qty 2

## 2018-07-03 MED ORDER — OXYCODONE-ACETAMINOPHEN 5-325 MG PO TABS
1.0000 | ORAL_TABLET | ORAL | Status: DC | PRN
  Filled 2018-07-03: qty 2

## 2018-07-03 MED ORDER — HYDROMORPHONE HCL 2 MG/ML IJ SOLN
INTRAMUSCULAR | Status: AC
  Filled 2018-07-03: qty 1

## 2018-07-03 MED ORDER — FENTANYL CITRATE (PF) 100 MCG/2ML IJ SOLN
INTRAMUSCULAR | Status: DC | PRN
  Administered 2018-07-03: 50 ug via INTRAVENOUS
  Administered 2018-07-03: 100 ug via INTRAVENOUS
  Administered 2018-07-03: 50 ug via INTRAVENOUS
  Administered 2018-07-03: 75 ug via INTRAVENOUS
  Administered 2018-07-03 (×2): 50 ug via INTRAVENOUS
  Administered 2018-07-03: 25 ug via INTRAVENOUS

## 2018-07-03 MED ORDER — METRONIDAZOLE IN NACL 5-0.79 MG/ML-% IV SOLN
INTRAVENOUS | Status: AC
  Filled 2018-07-03: qty 100

## 2018-07-03 MED ORDER — MENTHOL 3 MG MT LOZG
1.0000 | LOZENGE | OROMUCOSAL | Status: DC | PRN
  Filled 2018-07-03: qty 9

## 2018-07-03 MED ORDER — LACTATED RINGERS IV SOLN
INTRAVENOUS | Status: DC
  Administered 2018-07-03: 125 mL/h via INTRAVENOUS
  Administered 2018-07-03: 11:00:00 via INTRAVENOUS
  Filled 2018-07-03: qty 1000

## 2018-07-03 MED ORDER — ONDANSETRON HCL 4 MG PO TABS
4.0000 mg | ORAL_TABLET | Freq: Four times a day (QID) | ORAL | Status: DC | PRN
  Filled 2018-07-03: qty 1

## 2018-07-03 MED ORDER — LIDOCAINE HCL (CARDIAC) PF 100 MG/5ML IV SOSY
PREFILLED_SYRINGE | INTRAVENOUS | Status: DC | PRN
  Administered 2018-07-03: 40 mg via INTRAVENOUS

## 2018-07-03 MED ORDER — ENOXAPARIN SODIUM 40 MG/0.4ML ~~LOC~~ SOLN
40.0000 mg | SUBCUTANEOUS | Status: AC
  Administered 2018-07-03: 06:00:00 40 mg via SUBCUTANEOUS
  Filled 2018-07-03: qty 0.4

## 2018-07-03 MED ORDER — OXYCODONE HCL 5 MG/5ML PO SOLN
5.0000 mg | Freq: Once | ORAL | Status: DC | PRN
  Administered 2018-07-03: 5 mg via ORAL
  Filled 2018-07-03: qty 5

## 2018-07-03 MED ORDER — GABAPENTIN 300 MG PO CAPS
300.0000 mg | ORAL_CAPSULE | ORAL | Status: AC
  Administered 2018-07-03: 06:00:00 300 mg via ORAL
  Filled 2018-07-03: qty 1

## 2018-07-03 MED ORDER — LOSARTAN POTASSIUM-HCTZ 50-12.5 MG PO TABS
1.0000 | ORAL_TABLET | Freq: Every day | ORAL | Status: DC
  Administered 2018-07-03: 1 via ORAL

## 2018-07-03 MED ORDER — OXYCODONE HCL 5 MG PO TABS
5.0000 mg | ORAL_TABLET | Freq: Once | ORAL | Status: AC | PRN
  Administered 2018-07-03: 5 mg via ORAL
  Filled 2018-07-03: qty 1

## 2018-07-03 MED ORDER — OXYCODONE-ACETAMINOPHEN 5-325 MG PO TABS: 1.0000 | ORAL_TABLET | ORAL | 0 refills | Status: DC | PRN

## 2018-07-03 MED ORDER — ONDANSETRON HCL 4 MG/2ML IJ SOLN
4.0000 mg | Freq: Four times a day (QID) | INTRAMUSCULAR | Status: DC | PRN
  Filled 2018-07-03: qty 2

## 2018-07-03 MED ORDER — CIPROFLOXACIN IN D5W 400 MG/200ML IV SOLN
400.0000 mg | INTRAVENOUS | Status: AC
  Administered 2018-07-03: 07:00:00 400 mg via INTRAVENOUS
  Filled 2018-07-03: qty 200

## 2018-07-03 MED ORDER — KETOROLAC TROMETHAMINE 30 MG/ML IJ SOLN
INTRAMUSCULAR | Status: AC
  Filled 2018-07-03: qty 1

## 2018-07-03 MED ORDER — SODIUM CHLORIDE 0.9 % IR SOLN
Status: DC | PRN
  Administered 2018-07-03: 3000 mL

## 2018-07-03 MED ORDER — HYDROMORPHONE HCL 1 MG/ML IJ SOLN
INTRAMUSCULAR | Status: DC | PRN
  Administered 2018-07-03: 1 mg via INTRAVENOUS
  Administered 2018-07-03 (×2): 0.5 mg via INTRAVENOUS

## 2018-07-03 MED ORDER — GABAPENTIN 300 MG PO CAPS
ORAL_CAPSULE | ORAL | Status: AC
  Filled 2018-07-03: qty 1

## 2018-07-03 MED ORDER — ONDANSETRON HCL 4 MG/2ML IJ SOLN
4.0000 mg | Freq: Once | INTRAMUSCULAR | Status: DC | PRN
  Filled 2018-07-03: qty 2

## 2018-07-03 MED ORDER — PROPOFOL 10 MG/ML IV BOLUS
INTRAVENOUS | Status: DC | PRN
  Administered 2018-07-03: 170 mg via INTRAVENOUS

## 2018-07-03 MED ORDER — ENOXAPARIN SODIUM 40 MG/0.4ML ~~LOC~~ SOLN
SUBCUTANEOUS | Status: AC
  Filled 2018-07-03: qty 0.4

## 2018-07-03 MED ORDER — LIDOCAINE 2% (20 MG/ML) 5 ML SYRINGE
INTRAMUSCULAR | Status: AC
  Filled 2018-07-03: qty 5

## 2018-07-03 MED ORDER — BUPIVACAINE HCL (PF) 0.25 % IJ SOLN
INTRAMUSCULAR | Status: DC | PRN
  Administered 2018-07-03: 8 mL

## 2018-07-03 MED ORDER — SUGAMMADEX SODIUM 200 MG/2ML IV SOLN
INTRAVENOUS | Status: DC | PRN
  Administered 2018-07-03: 300 mg via INTRAVENOUS

## 2018-07-03 MED ORDER — FENTANYL CITRATE (PF) 250 MCG/5ML IJ SOLN
INTRAMUSCULAR | Status: AC
  Filled 2018-07-03: qty 5

## 2018-07-03 MED ORDER — PROPOFOL 10 MG/ML IV BOLUS
INTRAVENOUS | Status: AC
  Filled 2018-07-03: qty 20

## 2018-07-03 MED ORDER — CHANTIX STARTING MONTH PAK 0.5 MG X 11 & 1 MG X 42 PO TABS: ORAL_TABLET | ORAL | 0 refills | Status: DC

## 2018-07-03 MED ORDER — FENTANYL CITRATE (PF) 100 MCG/2ML IJ SOLN
25.0000 ug | INTRAMUSCULAR | Status: DC | PRN
  Administered 2018-07-03 (×2): 25 ug via INTRAVENOUS
  Administered 2018-07-03: 50 ug via INTRAVENOUS
  Filled 2018-07-03: qty 1

## 2018-07-03 MED ORDER — MORPHINE SULFATE (PF) 2 MG/ML IV SOLN
1.0000 mg | INTRAVENOUS | Status: DC | PRN
  Filled 2018-07-03: qty 1

## 2018-07-03 MED ORDER — KETOROLAC TROMETHAMINE 30 MG/ML IJ SOLN
30.0000 mg | Freq: Four times a day (QID) | INTRAMUSCULAR | Status: DC
  Administered 2018-07-03: 13:00:00 30 mg via INTRAVENOUS
  Filled 2018-07-03: qty 1

## 2018-07-03 MED ORDER — DEXAMETHASONE SODIUM PHOSPHATE 10 MG/ML IJ SOLN
INTRAMUSCULAR | Status: AC
  Filled 2018-07-03: qty 1

## 2018-07-03 MED ORDER — METRONIDAZOLE IN NACL 5-0.79 MG/ML-% IV SOLN
500.0000 mg | INTRAVENOUS | Status: AC
  Administered 2018-07-03: 500 mg via INTRAVENOUS
  Filled 2018-07-03: qty 100

## 2018-07-03 MED ORDER — FENTANYL CITRATE (PF) 100 MCG/2ML IJ SOLN
INTRAMUSCULAR | Status: AC
  Filled 2018-07-03: qty 2

## 2018-07-03 MED ORDER — ROCURONIUM BROMIDE 10 MG/ML (PF) SYRINGE
PREFILLED_SYRINGE | INTRAVENOUS | Status: AC
  Filled 2018-07-03: qty 10

## 2018-07-03 MED ORDER — MIDAZOLAM HCL 5 MG/5ML IJ SOLN
INTRAMUSCULAR | Status: DC | PRN
  Administered 2018-07-03: 2 mg via INTRAVENOUS

## 2018-07-03 MED ORDER — ONDANSETRON 4 MG PO TBDP
ORAL_TABLET | ORAL | Status: AC
  Filled 2018-07-03: qty 1

## 2018-07-03 MED ORDER — ACETAMINOPHEN 500 MG PO TABS
ORAL_TABLET | ORAL | Status: AC
  Filled 2018-07-03: qty 2

## 2018-07-03 MED ORDER — VARENICLINE TARTRATE 0.5 MG X 11 & 1 MG X 42 PO MISC: 1.0000 | Freq: Two times a day (BID) | ORAL | Status: DC

## 2018-07-03 MED ORDER — ONDANSETRON HCL 4 MG/2ML IJ SOLN
INTRAMUSCULAR | Status: AC
  Filled 2018-07-03: qty 2

## 2018-07-03 MED ORDER — SUGAMMADEX SODIUM 200 MG/2ML IV SOLN
INTRAVENOUS | Status: AC
  Filled 2018-07-03: qty 2

## 2018-07-03 MED ORDER — LACTATED RINGERS IV SOLN
INTRAVENOUS | Status: DC
  Filled 2018-07-03: qty 1000

## 2018-07-03 MED ORDER — ACETAMINOPHEN 500 MG PO TABS
1000.0000 mg | ORAL_TABLET | ORAL | Status: AC
  Administered 2018-07-03: 1000 mg via ORAL
  Filled 2018-07-03: qty 2

## 2018-07-03 MED ORDER — CIPROFLOXACIN IN D5W 400 MG/200ML IV SOLN
INTRAVENOUS | Status: AC
  Filled 2018-07-03: qty 200

## 2018-07-03 MED ORDER — ROCURONIUM BROMIDE 100 MG/10ML IV SOLN
INTRAVENOUS | Status: DC | PRN
  Administered 2018-07-03: 30 mg via INTRAVENOUS
  Administered 2018-07-03: 20 mg via INTRAVENOUS
  Administered 2018-07-03: 60 mg via INTRAVENOUS
  Administered 2018-07-03: 5 mg via INTRAVENOUS

## 2018-07-03 MED ORDER — ONDANSETRON HCL 4 MG/2ML IJ SOLN
INTRAMUSCULAR | Status: DC | PRN
  Administered 2018-07-03 (×2): 4 mg via INTRAVENOUS

## 2018-07-03 SURGICAL SUPPLY — 65 items
ADH SKN CLS APL DERMABOND .7 (GAUZE/BANDAGES/DRESSINGS) ×2
APL SRG 38 LTWT LNG FL B (MISCELLANEOUS) ×2
APPLICATOR ARISTA FLEXITIP XL (MISCELLANEOUS) ×1 IMPLANT
BARRIER ADHS 3X4 INTERCEED (GAUZE/BANDAGES/DRESSINGS) IMPLANT
BLADE SURG 10 STRL SS (BLADE) ×4 IMPLANT
BRR ADH 4X3 ABS CNTRL BYND (GAUZE/BANDAGES/DRESSINGS)
CABLE HIGH FREQUENCY MONO STRZ (ELECTRODE) IMPLANT
CANISTER SUCT 3000ML PPV (MISCELLANEOUS) ×3 IMPLANT
CELL SAVER LIPIGURD (MISCELLANEOUS) IMPLANT
COVER MAYO STAND STRL (DRAPES) ×3 IMPLANT
COVER SURGICAL LIGHT HANDLE (MISCELLANEOUS) ×1 IMPLANT
COVER TABLE BACK 60X90 (DRAPES) ×1 IMPLANT
COVER WAND RF STERILE (DRAPES) ×3 IMPLANT
DECANTER SPIKE VIAL GLASS SM (MISCELLANEOUS) ×4 IMPLANT
DERMABOND ADVANCED (GAUZE/BANDAGES/DRESSINGS) ×1
DERMABOND ADVANCED .7 DNX12 (GAUZE/BANDAGES/DRESSINGS) ×2 IMPLANT
DEVICE RETRIEVAL ALEXIS 14 (MISCELLANEOUS) IMPLANT
DURAPREP 26ML APPLICATOR (WOUND CARE) ×3 IMPLANT
EXTRT SYSTEM ALEXIS 14CM (MISCELLANEOUS)
EXTRT SYSTEM ALEXIS 17CM (MISCELLANEOUS) ×3
GAUZE 4X4 16PLY RFD (DISPOSABLE) ×3 IMPLANT
GLOVE BIO SURGEON STRL SZ 6.5 (GLOVE) ×6 IMPLANT
GLOVE BIOGEL PI IND STRL 7.0 (GLOVE) ×6 IMPLANT
GLOVE BIOGEL PI INDICATOR 7.0 (GLOVE) ×3
GOWN STRL REUS W/TWL LRG LVL3 (GOWN DISPOSABLE) ×13 IMPLANT
GOWN STRL REUS W/TWL XL LVL3 (GOWN DISPOSABLE) ×1 IMPLANT
HEMOSTAT ARISTA ABSORB 3G PWDR (HEMOSTASIS) ×1 IMPLANT
HOLDER FOLEY CATH W/STRAP (MISCELLANEOUS) ×1 IMPLANT
LIGASURE VESSEL 5MM BLUNT TIP (ELECTROSURGICAL) ×3 IMPLANT
NEEDLE INSUFFLATION 120MM (ENDOMECHANICALS) ×3 IMPLANT
OCCLUDER COLPOPNEUMO (BALLOONS) ×3 IMPLANT
PACK LAPAROSCOPY BASIN (CUSTOM PROCEDURE TRAY) ×3 IMPLANT
PACK TRENDGUARD 450 HYBRID PRO (MISCELLANEOUS) IMPLANT
POUCH LAPAROSCOPIC INSTRUMENT (MISCELLANEOUS) ×3 IMPLANT
PROTECTOR NERVE ULNAR (MISCELLANEOUS) ×6 IMPLANT
RETRACTOR WOUND ALXS 19CM XSML (INSTRUMENTS) IMPLANT
RTRCTR WOUND ALEXIS 19CM XSML (INSTRUMENTS)
SCISSORS LAP 5X35 DISP (ENDOMECHANICALS) IMPLANT
SET IRRIG TUBING LAPAROSCOPIC (IRRIGATION / IRRIGATOR) ×3 IMPLANT
SET IRRIG Y TYPE TUR BLADDER L (SET/KITS/TRAYS/PACK) ×3 IMPLANT
SET TRI-LUMEN FLTR TB AIRSEAL (TUBING) ×3 IMPLANT
SHEARS HARMONIC ACE PLUS 36CM (ENDOMECHANICALS) ×3 IMPLANT
SLEEVE ADV FIXATION 5X100MM (TROCAR) ×3 IMPLANT
SUT VIC AB 0 CT1 27 (SUTURE) ×6
SUT VIC AB 0 CT1 27XBRD ANBCTR (SUTURE) ×4 IMPLANT
SUT VICRYL 0 UR6 27IN ABS (SUTURE) ×1 IMPLANT
SUT VICRYL 4-0 PS2 18IN ABS (SUTURE) ×3 IMPLANT
SUT VLOC 180 0 9IN  GS21 (SUTURE) ×1
SUT VLOC 180 0 9IN GS21 (SUTURE) IMPLANT
SYR 10ML LL (SYRINGE) ×3 IMPLANT
SYR 50ML LL SCALE MARK (SYRINGE) ×6 IMPLANT
SYSTEM CARTER THOMASON II (TROCAR) ×1 IMPLANT
SYSTEM CONTND EXTRCTN KII BLLN (MISCELLANEOUS) IMPLANT
TIP RUMI ORANGE 6.7MMX12CM (TIP) IMPLANT
TIP UTERINE 5.1X6CM LAV DISP (MISCELLANEOUS) IMPLANT
TIP UTERINE 6.7X10CM GRN DISP (MISCELLANEOUS) IMPLANT
TIP UTERINE 6.7X6CM WHT DISP (MISCELLANEOUS) IMPLANT
TIP UTERINE 6.7X8CM BLUE DISP (MISCELLANEOUS) ×1 IMPLANT
TOWEL OR 17X26 10 PK STRL BLUE (TOWEL DISPOSABLE) ×5 IMPLANT
TRAY FOLEY W/BAG SLVR 14FR (SET/KITS/TRAYS/PACK) ×3 IMPLANT
TRENDGUARD 450 HYBRID PRO PACK (MISCELLANEOUS) ×3
TROCAR ADV FIXATION 5X100MM (TROCAR) ×3 IMPLANT
TROCAR PORT AIRSEAL 8X120 (TROCAR) ×3 IMPLANT
TROCAR XCEL NON-BLD 5MMX100MML (ENDOMECHANICALS) ×3 IMPLANT
WARMER LAPAROSCOPE (MISCELLANEOUS) ×3 IMPLANT

## 2018-07-03 NOTE — Op Note (Signed)
OPERATIVE REPORT  PREOPERATIVE DIAGNOSIS:   Symptomatic fibroids.  POSTOPERATIVE DIAGNOSIS:    Symptomatic fibroids.  PROCEDURES: Total laparoscopic hysterectomy with bilateral salpingectomy, lysis of adhesions, vaginal morcellation of uterine specimen in Alexis bag, cystoscopy  SURGEON: Lenard Galloway, M.D.  ASSISTANT:  Edwinna Areola, M.D.  ANESTHESIA: General endotracheal, local with 0.25% Marcaine.  IVF:  1000 cc LR.  ESTIMATED BLOOD LOSS:  40 cc.  URINE OUTPUT: 125 cc.  COMPLICATIONS: None.  INDICATIONS FOR THE PROCEDURE:    The patient is a 43 year old Gravida 3, Para 2 African American female, status post bilateral tubal ligation who presented with a dysmenorrhea, heavy menses, and degenerating uterine fibroid.  Her uterine size was 18 weeks.  She has multiple fibroids and a benign endometrial biopsy. She was recently treated for trichomonas.  A plan is made to proceed with a total laparoscopic hysterectomy with bilateral salpingectomy, possible bilateral oophorctomy, vaginal morcellation of uterine specimen, and cystoscopy after risks, benefits, and alternatives are reviewed.  FINDINGS:    Laparoscopy revealed a fibroid uterus with a 10 cm fundal fibroid and normal ovaries.  Her fallopian tubes were consistent with tubal ligation.  There were adnexal adhesions.  There were omental adhesions to the anterior abdominal wall below the level of the umbilicus.  The upper abdomen was normal and demonstrated a normal liver.  The appendix was normal.   Cystoscopy at the termination of the procedure showed the bladder to be normal throughout 360 degrees including the bladder dome and trigone. There was no evidence of any foreign body in the bladder or the urethra. There was no evidence of any lesions  of the bladder or the urethra.  Both of the ureters were noted to be patent bilaterally.  SPECIMENS:    The uterus, cervix, and bilateral tubes were went  to pathology.   DESCRIPTION OF PROCEDURE:   The patient was reidentified in the preoperative hold area.  She did receive  Ciprofloxacin and Flagyl IV for antibiotic prophylaxis. She received lovenox, TED hose, and PAS stockings for DVT prophylaxis.  In the operating room, the patient was placed in the dorsal lithotomy position on the operating room table.The Trendguard was used to support and stabilize her on the operating room table.  Her legs were placed in the [place] stirrups and her arms were both tucked at her sides. The patient received general endotracheal anesthesia. The abdomen and vagina were then sterilely prepped and she was sterilely draped.  A speculum was placed in the vagina and a single-tooth tenaculum was placed on the anterior cervical lip. A figure-of-eight suture of 0 Vicryl was placed on each the anterior and the posterior cervical lips. The uterus was sounded to almost 8 cm. The cervix was then dilated with [facility] dilators. A #[id number] tip with a KOH ring was then placed through the cervix and into the uterine cavity without difficulty. The remaining vaginal instruments were then removed. A Foley catheter was placed inside the bladder.  Attention was turned to the abdomen where the umbilical region was injected with 0.25% Marcaine and a small incision created.  A Veress needle was then used to insufflate the abdomen with CO2 gas after a saline drop test was performed and the fluid flowed freely.  A 5 mm umbilical incision was created with a scalpel after the skin.  The skin was elevated with towel clips, and a 5 mm camera port was then placed using the Optiview.The patient was placed in Trendelenburg position. 5 mm incisions  were then created in the left upper abdomen and the left lower abdomen after the skin was injected locally with 0.25% Marcaine.  The 5 mm trocars were then placed under visualization of the laparoscope.  The harmonic scalpel was used to lyse the  omental adhesions attached to the abdominal wall.  An 8 mm AirSeal trocar was placed in the right lower quadrant after injecting with Marcaine and incising with a scalpel.    An inspection of the abdomen and pelvis was performed. The findings are as noted above.    The left ureter was identified.  The left adnexal adhesions were removed with the Harmonic scalpel.  The left fallopian tube was grasped and the Ligasure was used to cauterize and cut through the mesosalpinx and then the proximal tube.  The specimen was sent to pathology.  The left utero-ovarian ligament was similarly cauterized and cut with the Ligasure. The left round ligament was then cauterized and divided with same instrument. Dissection was performed to the anterior and posterior leaves of the broad ligaments using the Harmonic Scalpel. The incision was carried across the anterior cul- de-sac along the vesicouterine fold and the bladder was dissected away from the cervix using the Harmonic scalpel. The peritoneum was taken down posteriorly. The left uterine artery was skeletonized using the same instrument.  It was then cauterized with the Ligasure instrument, but not cut until the right uterine artery was later cauterized and cut.   Attention was turned to the patient's right-hand side at this time. The right ureter was identified. The adnexal adhesions were lysed with the Harmonic scalpel.  The same procedure that was performed on the left side was repeated on the right side with respect to the isolation, cautery, and transection of the vessels and the bladder flap dissection.  The right fallopian tube was removed from the peritoneal cavity as well.  The KOH ring was nicely visible. The colpotomy incision was performed with the Harmonic Scalpel in a circumferential fashion. The Alexis bag was placed inside the peritoneal cavity and the uterus was then placed inside the Hughes bag, which was drawn down and out of the vagina.  A  weighted retractor was placed inside the bag, and the uterus and cervix were morcelled with a scalpel inside the Alexis bag.  The specimen was set aside to later send to pathology. The balloon occluder was placed in the vagina. The vaginal cuff was sutured at this time using a running suture of 0 V-Loc. The vagina was closed from the patient's right hand side to the left hand side and then back 2 sutures towards the midline.The balloon occluder was inadvertently popped with the needle, and it was removed from the vagina.  The entire balloon was confirmed to be present.  The closure of the vagina provided good full-thickness closure of the vaginal cuff.  The laparoscopic needle for suturing was removed from the peritoneal cavity.  The pelvis was irrigated and suctioned.  The pneumoperitoneal was let down.  There was good hemostasis of the operative sites and pedicles.  Arista was placed over the surgical sites.    The 8 mm trocar was removed and the [employer] instrument was used to close the fascia with a 0/0 vicryl suture.   The remaining left sided laparoscopic trocars were removed under visualization of the laparoscope. The CO2 pneumoperitoneum was released. The patient received manual breaths to remove any remaining CO2 gas and then the umbilical trocar was removed.  The patient's Foley catheter  was removed at this time and cystoscopy was performed and the findings are as noted above. The Foley catheter was left out.   Final inspection of the vagina demonstrated good hemostasis of the vaginal cuff.  All skin incisions were closed with subcuticular sutures of 4-0 Vicryl. Dermabond was placed over the incisions.  This concluded the patient's procedure. She was extubated and escorted to the recovery room in stable and awake condition. There were no complications to the procedure. All needle, instrument, and sponge counts were correct.   Lenard Galloway, M.D.

## 2018-07-03 NOTE — Progress Notes (Signed)
Update to History and Physical  Patient completed oral Flagyl for vaginal trichomonas and took course of Bactrim DS for 2 small vulvar/thigh abscesses. Patient examined.  OK to proceed with surgery.

## 2018-07-03 NOTE — Anesthesia Preprocedure Evaluation (Signed)
Anesthesia Evaluation  Patient identified by MRN, date of birth, ID band Patient awake    Reviewed: Allergy & Precautions, NPO status , Patient's Chart, lab work & pertinent test results  Airway Mallampati: II  TM Distance: >3 FB Neck ROM: Full    Dental  (+) Teeth Intact, Missing,    Pulmonary Current Smoker,    breath sounds clear to auscultation       Cardiovascular hypertension,  Rhythm:Regular Rate:Normal     Neuro/Psych    GI/Hepatic   Endo/Other    Renal/GU      Musculoskeletal   Abdominal   Peds  Hematology   Anesthesia Other Findings   Reproductive/Obstetrics                             Anesthesia Physical Anesthesia Plan  ASA: II  Anesthesia Plan: General   Post-op Pain Management:    Induction: Intravenous  PONV Risk Score and Plan: Ondansetron and Dexamethasone  Airway Management Planned: Oral ETT  Additional Equipment:   Intra-op Plan:   Post-operative Plan: Extubation in OR  Informed Consent: I have reviewed the patients History and Physical, chart, labs and discussed the procedure including the risks, benefits and alternatives for the proposed anesthesia with the patient or authorized representative who has indicated his/her understanding and acceptance.     Dental advisory given  Plan Discussed with: CRNA and Anesthesiologist  Anesthesia Plan Comments:         Anesthesia Quick Evaluation

## 2018-07-03 NOTE — Progress Notes (Signed)
Day of Surgery Procedure(s) (LRB): TOTAL LAPAROSCOPIC HYSTERECTOMY WITH SALPINGECTOMY (Bilateral) CYSTOSCOPY (N/A)  Subjective: Patient reports nausea, tolerating PO and no problems voiding.   Ambulating.  Keeping food and Percocet down ok.  Would like for discharge to home.   Objective: I have reviewed patient's vital signs, intake and output, medications and labs.  Vitals:   07/03/18 1500 07/03/18 1600  BP: 138/84 130/87  Pulse: 84 82  Resp: 18 16  Temp: 97.9 F (36.6 C) 98.2 F (36.8 C)  SpO2: 99% 100%   UO - 425 cc.   General: alert and cooperative Resp: clear to auscultation bilaterally Cardio: regular rate and rhythm, S1, S2 normal, no murmur, click, rub or gallop GI: soft, non-tender; bowel sounds normal; no masses,  no organomegaly and incision: clean, dry, intact and left upper incision with small induration consistent with hematoma - 1.5 - 2 cm.  No ecchymoses.  Extremities: Ted hose and PAS on.  Vaginal Bleeding: minimal  CBC    Component Value Date/Time   WBC 9.7 07/03/2018 1714   RBC 3.91 07/03/2018 1714   HGB 11.2 (L) 07/03/2018 1714   HCT 34.1 (L) 07/03/2018 1714   PLT 239 07/03/2018 1714   MCV 87.2 07/03/2018 1714   MCH 28.6 07/03/2018 1714   MCHC 32.8 07/03/2018 1714   RDW 15.9 (H) 07/03/2018 1714   LYMPHSABS 1.6 10/01/2017 0618   MONOABS 0.8 10/01/2017 0618   EOSABS 0.0 10/01/2017 0618   BASOSABS 0.0 10/01/2017 0618    Assessment: s/p Procedure(s): TOTAL LAPAROSCOPIC HYSTERECTOMY WITH SALPINGECTOMY (Bilateral) CYSTOSCOPY (N/A): stable  Plan: Discharge home  Instructions reviewed in verbal and written form.  Percocet and Motrin for pain.  Zofran for nausea.  Surgical findings and procedure reviewed.  Questions answered. Fu in 1 week.    LOS: 0 days    Arloa Koh 07/03/2018, 5:45 PM

## 2018-07-03 NOTE — Anesthesia Postprocedure Evaluation (Signed)
Anesthesia Post Note  Patient: ELESHA THEDFORD  Procedure(s) Performed: TOTAL LAPAROSCOPIC HYSTERECTOMY WITH SALPINGECTOMY (Bilateral Uterus) CYSTOSCOPY (N/A Bladder)     Patient location during evaluation: PACU Anesthesia Type: General Level of consciousness: awake and alert Pain management: pain level controlled Vital Signs Assessment: post-procedure vital signs reviewed and stable Respiratory status: spontaneous breathing, nonlabored ventilation, respiratory function stable and patient connected to nasal cannula oxygen Cardiovascular status: blood pressure returned to baseline and stable Postop Assessment: no apparent nausea or vomiting Anesthetic complications: no    Last Vitals:  Vitals:   07/03/18 1400 07/03/18 1500  BP: (!) 146/93 138/84  Pulse: 79 84  Resp: 16 18  Temp: 36.6 C 36.6 C  SpO2: 100% 99%    Last Pain:  Vitals:   07/03/18 1400  TempSrc:   PainSc: 5                  Kasidy Gianino COKER

## 2018-07-03 NOTE — Transfer of Care (Signed)
Immediate Anesthesia Transfer of Care Note  Patient: Kara Morales  Procedure(s) Performed: TOTAL LAPAROSCOPIC HYSTERECTOMY WITH SALPINGECTOMY (Bilateral Uterus) CYSTOSCOPY (N/A Bladder)  Patient Location: PACU  Anesthesia Type:General  Level of Consciousness: awake, alert , oriented and patient cooperative  Airway & Oxygen Therapy: Patient Spontanous Breathing and Patient connected to face mask oxygen  Post-op Assessment: Report given to RN and Post -op Vital signs reviewed and stable  Post vital signs: Reviewed and stable  Last Vitals:  Vitals Value Taken Time  BP    Temp    Pulse 83 07/03/18 1113  Resp 11 07/03/18 1113  SpO2 100 % 07/03/18 1113  Vitals shown include unvalidated device data.  Last Pain:  Vitals:   07/03/18 0628  TempSrc: Oral  PainSc: 0-No pain      Patients Stated Pain Goal: 4 (10/93/23 5573)  Complications: No apparent anesthesia complications

## 2018-07-03 NOTE — Anesthesia Procedure Notes (Signed)
Procedure Name: Intubation Date/Time: 07/03/2018 7:38 AM Performed by: Garrel Ridgel, CRNA Pre-anesthesia Checklist: Patient identified, Emergency Drugs available, Suction available, Patient being monitored and Timeout performed Patient Re-evaluated:Patient Re-evaluated prior to induction Oxygen Delivery Method: Circle system utilized Preoxygenation: Pre-oxygenation with 100% oxygen Induction Type: IV induction Ventilation: Mask ventilation without difficulty Laryngoscope Size: Mac and 3 Grade View: Grade II Tube size: 7.0 mm Number of attempts: 1 Airway Equipment and Method: Stylet Placement Confirmation: ETT inserted through vocal cords under direct vision,  positive ETCO2 and breath sounds checked- equal and bilateral Secured at: 22 cm Tube secured with: Tape Dental Injury: Teeth and Oropharynx as per pre-operative assessment

## 2018-07-03 NOTE — Discharge Instructions (Signed)

## 2018-07-04 ENCOUNTER — Telehealth (HOSPITAL_COMMUNITY): Payer: Self-pay | Admitting: *Deleted

## 2018-07-04 ENCOUNTER — Encounter (HOSPITAL_BASED_OUTPATIENT_CLINIC_OR_DEPARTMENT_OTHER): Payer: Self-pay | Admitting: Obstetrics and Gynecology

## 2018-07-06 ENCOUNTER — Other Ambulatory Visit: Payer: Self-pay

## 2018-07-10 ENCOUNTER — Encounter: Payer: Self-pay | Admitting: Obstetrics and Gynecology

## 2018-07-10 ENCOUNTER — Ambulatory Visit (INDEPENDENT_AMBULATORY_CARE_PROVIDER_SITE_OTHER): Payer: BC Managed Care – PPO | Admitting: Obstetrics and Gynecology

## 2018-07-10 ENCOUNTER — Other Ambulatory Visit: Payer: Self-pay

## 2018-07-10 VITALS — BP 126/80 | HR 88 | Temp 97.2°F | Resp 14 | Wt 219.0 lb

## 2018-07-10 DIAGNOSIS — R300 Dysuria: Secondary | ICD-10-CM

## 2018-07-10 DIAGNOSIS — Z9889 Other specified postprocedural states: Secondary | ICD-10-CM

## 2018-07-10 LAB — POCT URINALYSIS DIPSTICK
Bilirubin, UA: NEGATIVE
Glucose, UA: NEGATIVE
Ketones, UA: NEGATIVE
Leukocytes, UA: NEGATIVE
Nitrite, UA: NEGATIVE
Protein, UA: NEGATIVE
Urobilinogen, UA: 0.2 E.U./dL
pH, UA: 5 (ref 5.0–8.0)

## 2018-07-10 MED ORDER — CIPROFLOXACIN HCL 500 MG PO TABS: 500.0000 mg | ORAL_TABLET | Freq: Two times a day (BID) | ORAL | 0 refills | Status: DC

## 2018-07-10 NOTE — Progress Notes (Signed)
GYNECOLOGY  VISIT   HPI: 43 y.o.   Single  African American  female   [id number] with Patient's last menstrual period was 05/01/2018.   here for 1 week post op TOTAL LAPAROSCOPIC HYSTERECTOMY WITH SALPINGECTOMY (Bilateral Uterus) CYSTOSCOPY (N/A Bladder)    Final pathology - benign fibroids.   Taking Percocet one every 6 hours.  May take 2 at night time.  Still has about 15 left.  Taking Motrin during the day time.   BM function is pretty normal per patient.  She has some burning sensation for the last couple of days.   No vaginal bleeding but does have a slight discharge.  Occasional smear with wiping.   No problems other than spotting from her right lower incision.   Doing some walking.  She needs help getting out of bed still.   Urine Dip - 2+ RBCs, pH 5.   GYNECOLOGIC HISTORY: Patient's last menstrual period was 05/01/2018. Contraception:  BTL Menopausal hormone therapy:  none Last mammogram:  10/26/17 [place] Right BIRADS3:Probably benign Last pap smear:   09/06/13 Neg. HR HPV:neg        OB History    Gravida  3   Para  2   Term  2   Preterm      AB  1   Living        SAB      TAB  1   Ectopic      Multiple      Live Births                 Patient Active Problem List   Diagnosis Date Noted  . Fibroids 07/03/2018  . Status post laparoscopic hysterectomy 07/03/2018  . Class 1 obesity due to excess calories with serious comorbidity and body mass index (BMI) of 33.0 to 33.9 in adult 07/18/2017  . Normocytic anemia due to blood loss 05/10/2017  . Moderate major depression, single episode ([place]) 05/10/2017  . Hypertriglyceridemia without hypercholesterolemia 05/10/2017  . Leiomyoma of uterus, unspecified 09/06/2013  . BP (high blood pressure) 08/09/2013    Past Medical History:  Diagnosis Date  . Abnormal Pap smear of cervix 1994?  Marland Kitchen Anemia   . Anxiety   . Depression   . Fibroid   . Hypertension 2015  . Trichomonas infection 06/2018   Treated  with Flagyl 2 gram po 06/28/18.    Past Surgical History:  Procedure Laterality Date  . COLPOSCOPY  1994?  Consuela Mimes N/A 07/03/2018   Procedure: CYSTOSCOPY;  Surgeon: Nunzio Cobbs, MD;  [facility]: [facility];  Service: Gynecology;  Laterality: N/A;  . TOTAL LAPAROSCOPIC HYSTERECTOMY WITH SALPINGECTOMY Bilateral 07/03/2018   Procedure: TOTAL LAPAROSCOPIC HYSTERECTOMY WITH SALPINGECTOMY;  Surgeon: Nunzio Cobbs, MD;  Location: [facility];  Service: Gynecology;  Laterality: Bilateral;  . TUBAL LIGATION  06/2003    Current Outpatient Medications  Medication Sig Dispense Refill  . docusate sodium (COLACE) 100 MG capsule Take 100 mg by mouth 2 (two) times daily.    Marland Kitchen ibuprofen (ADVIL,MOTRIN) 600 MG tablet Take 1 tablet (600 mg total) by mouth every 6 (six) hours as needed. 30 tablet 0  . losartan-hydrochlorothiazide (HYZAAR) 100-25 MG tablet Take 1 tablet by mouth daily.    . Multiple Vitamin (MULTIVITAMIN) tablet Take 1 tablet by mouth daily.    . ondansetron (ZOFRAN) 4 MG tablet Take 1 tablet (4 mg total) by mouth every 6 (six) hours as needed for  nausea. 20 tablet 0  . oxyCODONE-acetaminophen (PERCOCET/ROXICET) 5-325 MG tablet Take 1-2 tablets by mouth every 4 (four) hours as needed for moderate pain. 30 tablet 0  . varenicline (CHANTIX STARTING MONTH PAK) 0.5 MG X 11 & 1 MG X 42 tablet Take one 0.5 mg tablet by mouth once daily for 3 days, then increase to one 0.5 mg tablet twice daily for 4 days, then increase to one 1 mg tablet twice daily. 53 tablet 0   No current facility-administered medications for this visit.      ALLERGIES: Cephalexin and Lisinopril  Family History  Problem Relation Age of Onset  . Thyroid disease Mother   . Hypertension Father   . Hypertension Maternal Grandmother   . Diabetes Maternal Grandmother   . Hypertension Paternal Grandmother   . Diabetes Paternal Grandmother   . Breast cancer Paternal  Grandmother   . Stroke Paternal Grandmother     Social History   Socioeconomic History  . Marital status: Single    Spouse name: Not on file  . Number of children: Not on file  . Years of education: Not on file  . Highest education level: Not on file  Occupational History  . Not on file  Social Needs  . Financial resource strain: Not on file  . Food insecurity    Worry: Not on file    Inability: Not on file  . Transportation needs    Medical: Not on file    Non-medical: Not on file  Tobacco Use  . Smoking status: Current Every Day Smoker    Types: Cigars  . Smokeless tobacco: Never Used  . Tobacco comment: pt stopped 06/10/18  Substance and Sexual Activity  . Alcohol use: Yes    Alcohol/week: 1.0 standard drinks    Types: 1 Standard drinks or equivalent per week  . Drug use: No  . Sexual activity: Yes    Partners: Male    Birth control/protection: Surgical    Comment: tubal ligation  Lifestyle  . Physical activity    Days per week: Not on file    Minutes per session: Not on file  . Stress: Not on file  Relationships  . Social Herbalist on phone: Not on file    Gets together: Not on file    Attends religious service: Not on file    Active member of club or organization: Not on file    Attends meetings of clubs or organizations: Not on file    Relationship status: Not on file  . Intimate partner violence    Fear of current or ex partner: Not on file    Emotionally abused: Not on file    Physically abused: Not on file    Forced sexual activity: Not on file  Other Topics Concern  . Not on file  Social History Narrative  . Not on file    Review of Systems  Constitutional: Negative.   HENT: Negative.   Eyes: Negative.   Respiratory: Negative.   Cardiovascular: Negative.   Gastrointestinal: Negative.   Endocrine: Negative.   Genitourinary: Negative.   Musculoskeletal: Negative.   Skin: Negative.   Allergic/Immunologic: Negative.    Neurological: Negative.   Hematological: Negative.   Psychiatric/Behavioral: Negative.     PHYSICAL EXAMINATION:    BP 126/80 (BP Location: Right Arm, Patient Position: Sitting, Cuff Size: Normal)   Pulse 88   Temp (!) 97.2 F (36.2 C) (Temporal)   Resp 14   Wt 219  lb (99.3 kg)   LMP 05/01/2018   BMI 33.79 kg/m     General appearance: alert, cooperative and appears stated age   Abdomen: right incision and left upper incisions with 1.5 cm hematomas and surrounding ecchymoses.  Abdomen is non-tender, no masses,  no organomegaly   Pelvic: External genitalia:  no lesions              Urethra:  normal appearing urethra with no masses, tenderness or lesions              Bartholins and Skenes: normal                 Vagina: normal appearing vagina with normal color and discharge, no lesions              Cervix: absent.  Yellow pink drainage.  Cuff intact. Some induration which I think is just post op edema. Nontender.                 Bimanual Exam:  Uterus: absent.  No adnexal masses.  Chaperone was present for exam.  ASSESSMENT  Status post laparoscopic hysterectomy with bilateral salpingectomy, LOA, morcellation of uterus vaginally in Alexis bag, and cystoscopy.  Dysuria.    PLAN  Will check urine today - RBCs.   Will send UC.  Will treat with Ciprofloxacin 500 mg po bid x 7 days.  Start to wean off Percocet.  Follow up in 1 week for a recheck.     An After Visit Summary was printed and given to the patient.

## 2018-07-11 LAB — URINE CULTURE: Organism ID, Bacteria: NO GROWTH

## 2018-07-11 LAB — URINALYSIS, MICROSCOPIC ONLY: Casts: NONE SEEN /lpf

## 2018-07-12 ENCOUNTER — Telehealth: Payer: Self-pay

## 2018-07-12 NOTE — Telephone Encounter (Signed)
Encounter reviewed and closed.

## 2018-07-12 NOTE — Telephone Encounter (Signed)
Spoke with patient and notified of negative urine culture. She states she is feeling about the same, but  did not get Cipro until last PM. She will keep f/u appt. 07-18-18 with Dr.Silva and will call back if increased symptoms. Routed to provider.

## 2018-07-12 NOTE — Telephone Encounter (Signed)
-----   Message from Nunzio Cobbs, MD sent at 07/12/2018  8:08 AM EDT ----- Please contact patient with results of testing.  Her urine culture is negative for infection.  I would recommend she continue on the Ciprofloxacin and see me back next week.  Please let me know how she is doing.

## 2018-07-13 ENCOUNTER — Other Ambulatory Visit: Payer: Self-pay

## 2018-07-14 NOTE — Discharge Summary (Signed)
Physician Discharge Summary  Patient ID: Kara Morales MRN: [id number] DOB/AGE: 1975/05/26 43 y.o.  Admit date: 07/03/2018 Discharge date: 07/03/2018 Admission Diagnoses: 1.  Uterine fibroids.  Discharge Diagnoses:  1.  Uterine fibroids.  2.  Pelvic and abdominal adhesions.  3.  Status post total laparoscopic hysterectomy with bilateral salpingectomy, lysis of adhesions, vaginal morcellation of uterus in Alexis bag, and cystoscopy.   Active Problems:   Fibroids   Status post laparoscopic hysterectomy   Discharged Condition: good  Hospital Course:  The patient was admitted on 07/03/18 for a total laparoscopic hysterectomy with bilateral salpingectomy, lysis of adhesions, vaginal morcellation of uterus in Alexis bag, and cystoscopy, which were performed without complication while under general anesthesia.  The patient's post op course was uneventful.  She received IV morphine and Toradol for pain control initially, and this was converted over to Percocet and Motrin one when the patient began taking po well.  She tolerated a regular diet.  She ambulated independently and wore PAS and Ted hose for DVT prophylaxis while in bed.  She received Lovenox preop for prophylaxis as well.  Her foley catheter was removed at the end of her procedure, and she voided well post op. The patient's vital signs remained stable and she demonstrated no signs of infection during her hospitalization.  The patient's discharge one Hgb was 11.2 .   She was tolerating the this well.  She had very minimal vaginal bleeding, and her incision(s) demonstrated no signs of erythema or significant drainage.  Her left upper incision demonstrated a small hematoma.  She was found to be in good condition and ready for discharge on post op day zero  Consults: None  Significant Diagnostic Studies: labs:  See Hospital Course  Treatments: surgery:  Total laparoscopic hysterectomy with bilateral salpingectomy, lysis of adhesions,  vaginal morcellation of uterus in Alexis bag, and cystoscopy performed on 07/03/18.  Discharge Exam: Blood pressure 130/87, pulse 78, temperature (!) 97 F (36.1 C), resp. rate 20, height 5' 7.5" (1.715 m), weight 100.3 kg, SpO2 100 %. General: alert and cooperative Resp: clear to auscultation bilaterally Cardio: regular rate and rhythm, S1, S2 normal, no murmur, click, rub or gallop GI: soft, non-tender; bowel sounds normal; no masses,  no organomegaly and incision: clean, dry, intact and left upper incision with small induration consistent with hematoma - 1.5 - 2 cm.  No ecchymoses.  Extremities: Ted hose and PAS on.  Vaginal Bleeding: minimal  Disposition:   The patient was discharged to home in good condition.  Instructions were reviewed in verbal and written form.  Surgical finding and procedure were discussed with the patient.   Allergies as of 07/03/2018      Reactions   Cephalexin Swelling   Lisinopril Swelling      Medication List    STOP taking these medications   sulfamethoxazole-trimethoprim 800-160 MG tablet Commonly known as: Bactrim DS     TAKE these medications   Chantix Starting Month Pak 0.5 MG X 11 & 1 MG X 42 tablet Generic drug: varenicline Take one 0.5 mg tablet by mouth once daily for 3 days, then increase to one 0.5 mg tablet twice daily for 4 days, then increase to one 1 mg tablet twice daily. What changed: additional instructions   ibuprofen 600 MG tablet Commonly known as: ADVIL Take 1 tablet (600 mg total) by mouth every 6 (six) hours as needed.   multivitamin tablet Take 1 tablet by mouth daily.   ondansetron 4 MG tablet  Commonly known as: ZOFRAN Take 1 tablet (4 mg total) by mouth every 6 (six) hours as needed for nausea.   oxyCODONE-acetaminophen 5-325 MG tablet Commonly known as: PERCOCET/ROXICET Take 1-2 tablets by mouth every 4 (four) hours as needed for moderate pain.      Follow-up Information    Nunzio Cobbs, MD In  1 week.   Specialty: Obstetrics and Gynecology Contact information: [address] [place] [address] [phone]           Signed: Arloa Koh 07/14/2018, 5:45 PM

## 2018-07-18 ENCOUNTER — Ambulatory Visit (INDEPENDENT_AMBULATORY_CARE_PROVIDER_SITE_OTHER): Payer: BC Managed Care – PPO | Admitting: Obstetrics and Gynecology

## 2018-07-18 ENCOUNTER — Other Ambulatory Visit: Payer: Self-pay

## 2018-07-18 ENCOUNTER — Telehealth: Payer: Self-pay | Admitting: Obstetrics and Gynecology

## 2018-07-18 ENCOUNTER — Encounter: Payer: Self-pay | Admitting: Obstetrics and Gynecology

## 2018-07-18 VITALS — BP 110/70 | HR 72 | Temp 97.5°F | Resp 12 | Ht 67.5 in | Wt 219.0 lb

## 2018-07-18 DIAGNOSIS — N898 Other specified noninflammatory disorders of vagina: Secondary | ICD-10-CM | POA: Diagnosis not present

## 2018-07-18 DIAGNOSIS — Z9889 Other specified postprocedural states: Secondary | ICD-10-CM

## 2018-07-18 NOTE — Progress Notes (Signed)
GYNECOLOGY  VISIT   HPI: 43 y.o.   Single  African American  female   [id number] with Patient's last menstrual period was 05/01/2018.   here for 2 week post op TOTAL LAPAROSCOPIC HYSTERECTOMY WITH SALPINGECTOMY (Bilateral Uterus) CYSTOSCOPY (N/A Bladder)    Final pathology - benign fibroids.   Not taking any more Percocet.  Using Motrin prn.   Not feeling any more burning sensation.  I did place her on Ciprofloxacin at the last visit for potential UTI.  Her UC was negative, and I had her continue the abx.   Having some vaginal drainage, yellowish brown, with occasional blood with wiping.  No itching.   GYNECOLOGIC HISTORY: Patient's last menstrual period was 05/01/2018. Contraception:  Hysterectomy Menopausal hormone therapy:  none Last mammogram:  10/26/17 [place] Right BIRADS3:Probably benign Last pap smear: 09/06/13 Neg. HR HPV:neg        OB History    Gravida  3   Para  2   Term  2   Preterm      AB  1   Living        SAB      TAB  1   Ectopic      Multiple      Live Births                 Patient Active Problem List   Diagnosis Date Noted  . Fibroids 07/03/2018  . Status post laparoscopic hysterectomy 07/03/2018  . Class 1 obesity due to excess calories with serious comorbidity and body mass index (BMI) of 33.0 to 33.9 in adult 07/18/2017  . Normocytic anemia due to blood loss 05/10/2017  . Moderate major depression, single episode ([place]) 05/10/2017  . Hypertriglyceridemia without hypercholesterolemia 05/10/2017  . Leiomyoma of uterus, unspecified 09/06/2013  . BP (high blood pressure) 08/09/2013    Past Medical History:  Diagnosis Date  . Abnormal Pap smear of cervix 1994?  Marland Kitchen Anemia   . Anxiety   . Depression   . Fibroid   . Hypertension 2015  . Trichomonas infection 06/2018   Treated with Flagyl 2 gram po 06/28/18.    Past Surgical History:  Procedure Laterality Date  . COLPOSCOPY  1994?  Consuela Mimes N/A 07/03/2018   Procedure:  CYSTOSCOPY;  Surgeon: Nunzio Cobbs, MD;  Location: [facility];  Service: Gynecology;  Laterality: N/A;  . TOTAL LAPAROSCOPIC HYSTERECTOMY WITH SALPINGECTOMY Bilateral 07/03/2018   Procedure: TOTAL LAPAROSCOPIC HYSTERECTOMY WITH SALPINGECTOMY;  Surgeon: Nunzio Cobbs, MD;  [facility];  Service: Gynecology;  Laterality: Bilateral;  . TUBAL LIGATION  06/2003    Current Outpatient Medications  Medication Sig Dispense Refill  . ibuprofen (ADVIL,MOTRIN) 600 MG tablet Take 1 tablet (600 mg total) by mouth every 6 (six) hours as needed. 30 tablet 0  . losartan-hydrochlorothiazide (HYZAAR) 100-25 MG tablet Take 1 tablet by mouth daily.    . Multiple Vitamin (MULTIVITAMIN) tablet Take 1 tablet by mouth daily.    . varenicline (CHANTIX STARTING MONTH PAK) 0.5 MG X 11 & 1 MG X 42 tablet Take one 0.5 mg tablet by mouth once daily for 3 days, then increase to one 0.5 mg tablet twice daily for 4 days, then increase to one 1 mg tablet twice daily. 53 tablet 0  . docusate sodium (COLACE) 100 MG capsule Take 100 mg by mouth 2 (two) times daily.    . ondansetron (ZOFRAN) 4 MG tablet Take 1 tablet (  4 mg total) by mouth every 6 (six) hours as needed for nausea. (Patient not taking: Reported on 07/18/2018) 20 tablet 0  . oxyCODONE-acetaminophen (PERCOCET/ROXICET) 5-325 MG tablet Take 1-2 tablets by mouth every 4 (four) hours as needed for moderate pain. (Patient not taking: Reported on 07/18/2018) 30 tablet 0   No current facility-administered medications for this visit.      ALLERGIES: Cephalexin and Lisinopril  Family History  Problem Relation Age of Onset  . Thyroid disease Mother   . Hypertension Father   . Hypertension Maternal Grandmother   . Diabetes Maternal Grandmother   . Hypertension Paternal Grandmother   . Diabetes Paternal Grandmother   . Breast cancer Paternal Grandmother   . Stroke Paternal Grandmother     Social History    Socioeconomic History  . Marital status: Single    Spouse name: Not on file  . Number of children: Not on file  . Years of education: Not on file  . Highest education level: Not on file  Occupational History  . Not on file  Social Needs  . Financial resource strain: Not on file  . Food insecurity    Worry: Not on file    Inability: Not on file  . Transportation needs    Medical: Not on file    Non-medical: Not on file  Tobacco Use  . Smoking status: Current Every Day Smoker    Types: Cigars  . Smokeless tobacco: Never Used  . Tobacco comment: pt stopped 06/10/18  Substance and Sexual Activity  . Alcohol use: Yes    Alcohol/week: 1.0 standard drinks    Types: 1 Standard drinks or equivalent per week  . Drug use: No  . Sexual activity: Yes    Partners: Male    Birth control/protection: Surgical    Comment: tubal ligation  Lifestyle  . Physical activity    Days per week: Not on file    Minutes per session: Not on file  . Stress: Not on file  Relationships  . Social Herbalist on phone: Not on file    Gets together: Not on file    Attends religious service: Not on file    Active member of club or organization: Not on file    Attends meetings of clubs or organizations: Not on file    Relationship status: Not on file  . Intimate partner violence    Fear of current or ex partner: Not on file    Emotionally abused: Not on file    Physically abused: Not on file    Forced sexual activity: Not on file  Other Topics Concern  . Not on file  Social History Narrative  . Not on file    Review of Systems  Constitutional: Negative.   HENT: Negative.   Eyes: Negative.   Respiratory: Negative.   Cardiovascular: Negative.   Gastrointestinal: Negative.   Endocrine: Negative.   Genitourinary: Negative.   Musculoskeletal: Negative.   Skin: Negative.   Allergic/Immunologic: Negative.   Neurological: Negative.   Hematological: Negative.   Psychiatric/Behavioral:  Negative.     PHYSICAL EXAMINATION:    BP 110/70 (BP Location: Left Arm, Patient Position: Sitting, Cuff Size: Normal)   Pulse 72   Temp (!) 97.5 F (36.4 C) (Temporal)   Resp 12   Ht 5' 7.5" (1.715 m)   Wt 219 lb (99.3 kg)   LMP 05/01/2018   BMI 33.79 kg/m     General appearance: alert, cooperative and appears  stated age   Abdomen: soft, non-tender, no masses,  no organomegaly.  Left upper and right sided incisions with 1 cm subcutaneous masses, suspect hematomas resolving and not hernias.  Nontender.   Pelvic: External genitalia:  no lesions              Urethra:  normal appearing urethra with no masses, tenderness or lesions              Bartholins and Skenes: normal                 Vagina: normal appearing vagina with normal color and discharge, no lesions              Cervix:  Absent.  Cuff intact.  No erythema.  Yellowish slightly blood tinged drainage.                  Bimanual Exam:  Uterus:   Absent.  No masses or tenderness.              Adnexa: no mass, fullness, tenderness             Chaperone was present for exam.  ASSESSMENT  Vaginal discharge post op.   No vaginal cuff cellulitis. Hx trichomonas prior to surgery. This was treated preop and intraop with Flagyl.  PLAN  Affirm done.  Continue decreased activity.  Fu in 1 week, sooner as needed.    An After Visit Summary was printed and given to the patient.

## 2018-07-18 NOTE — Telephone Encounter (Signed)
Appt scheduled for 07-24-18 at 1030.  Please notify patient.

## 2018-07-18 NOTE — Telephone Encounter (Signed)
Patient needs to be seen in one week for follow up. No available appointments.

## 2018-07-20 ENCOUNTER — Other Ambulatory Visit: Payer: Self-pay

## 2018-07-20 LAB — VAGINITIS/VAGINOSIS, DNA PROBE
Candida Species: NEGATIVE
Gardnerella vaginalis: NEGATIVE
Trichomonas vaginosis: NEGATIVE

## 2018-07-24 ENCOUNTER — Ambulatory Visit: Payer: BC Managed Care – PPO | Admitting: Obstetrics and Gynecology

## 2018-07-24 ENCOUNTER — Encounter: Payer: Self-pay | Admitting: Obstetrics and Gynecology

## 2018-07-24 ENCOUNTER — Other Ambulatory Visit: Payer: Self-pay

## 2018-07-24 ENCOUNTER — Ambulatory Visit (INDEPENDENT_AMBULATORY_CARE_PROVIDER_SITE_OTHER): Payer: BC Managed Care – PPO | Admitting: Obstetrics and Gynecology

## 2018-07-24 VITALS — BP 126/70 | HR 84 | Temp 97.4°F | Resp 12 | Ht 67.5 in | Wt 225.0 lb

## 2018-07-24 DIAGNOSIS — Z9889 Other specified postprocedural states: Secondary | ICD-10-CM

## 2018-07-24 NOTE — Progress Notes (Signed)
GYNECOLOGY  VISIT   HPI: 43 y.o.   Single  African American  female   [id number] with Patient's last menstrual period was 05/01/2018.   here for for 3 week post op.  Being followed for incision checks.  Has laparoscopic incisional hematomas and some increased vaginal drainage.    She may take Ibuprofen for discomfort as she gets in and out of bed.  No vaginal bleeding.  Some vaginal discharge which is lessened.  Incisions are improved with respect to the hematomas.   GYNECOLOGIC HISTORY: Patient's last menstrual period was 05/01/2018. Contraception:  Hysterectomy Menopausal hormone therapy:  none Last mammogram:  10/26/17 [place] Right BIRADS3:Probably benign Last pap smear:   06/22/18 Neg:Neg HR HPV        OB History    Gravida  3   Para  2   Term  2   Preterm      AB  1   Living        SAB      TAB  1   Ectopic      Multiple      Live Births                 Patient Active Problem List   Diagnosis Date Noted  . Fibroids 07/03/2018  . Status post laparoscopic hysterectomy 07/03/2018  . Class 1 obesity due to excess calories with serious comorbidity and body mass index (BMI) of 33.0 to 33.9 in adult 07/18/2017  . Normocytic anemia due to blood loss 05/10/2017  . Moderate major depression, single episode ([place]) 05/10/2017  . Hypertriglyceridemia without hypercholesterolemia 05/10/2017  . Leiomyoma of uterus, unspecified 09/06/2013  . BP (high blood pressure) 08/09/2013    Past Medical History:  Diagnosis Date  . Abnormal Pap smear of cervix 1994?  Marland Kitchen Anemia   . Anxiety   . Depression   . Fibroid   . Hypertension 2015  . Trichomonas infection 06/2018   Treated with Flagyl 2 gram po 06/28/18.    Past Surgical History:  Procedure Laterality Date  . COLPOSCOPY  1994?  Consuela Mimes N/A 07/03/2018   Procedure: CYSTOSCOPY;  Surgeon: Nunzio Cobbs, MD;  Location: [facility];  Service: Gynecology;  Laterality: N/A;  . TOTAL  LAPAROSCOPIC HYSTERECTOMY WITH SALPINGECTOMY Bilateral 07/03/2018   Procedure: TOTAL LAPAROSCOPIC HYSTERECTOMY WITH SALPINGECTOMY;  Surgeon: Nunzio Cobbs, MD;  Location: [facility];  Service: Gynecology;  Laterality: Bilateral;  . TUBAL LIGATION  06/2003    Current Outpatient Medications  Medication Sig Dispense Refill  . docusate sodium (COLACE) 100 MG capsule Take 100 mg by mouth 2 (two) times daily.    Marland Kitchen ibuprofen (ADVIL,MOTRIN) 600 MG tablet Take 1 tablet (600 mg total) by mouth every 6 (six) hours as needed. (Patient taking differently: Take 200 mg by mouth as needed. ) 30 tablet 0  . losartan-hydrochlorothiazide (HYZAAR) 100-25 MG tablet Take 1 tablet by mouth daily.    . Multiple Vitamin (MULTIVITAMIN) tablet Take 1 tablet by mouth daily.    . varenicline (CHANTIX STARTING MONTH PAK) 0.5 MG X 11 & 1 MG X 42 tablet Take one 0.5 mg tablet by mouth once daily for 3 days, then increase to one 0.5 mg tablet twice daily for 4 days, then increase to one 1 mg tablet twice daily. 53 tablet 0   No current facility-administered medications for this visit.      ALLERGIES: Cephalexin and Lisinopril  Family History  Problem Relation Age of Onset  . Thyroid disease Mother   . Hypertension Father   . Hypertension Maternal Grandmother   . Diabetes Maternal Grandmother   . Hypertension Paternal Grandmother   . Diabetes Paternal Grandmother   . Breast cancer Paternal Grandmother   . Stroke Paternal Grandmother     Social History   Socioeconomic History  . Marital status: Single    Spouse name: Not on file  . Number of children: Not on file  . Years of education: Not on file  . Highest education level: Not on file  Occupational History  . Not on file  Social Needs  . Financial resource strain: Not on file  . Food insecurity    Worry: Not on file    Inability: Not on file  . Transportation needs    Medical: Not on file    Non-medical: Not on file  Tobacco  Use  . Smoking status: Current Every Day Smoker    Types: Cigars  . Smokeless tobacco: Never Used  . Tobacco comment: pt stopped 06/10/18  Substance and Sexual Activity  . Alcohol use: Yes    Alcohol/week: 1.0 standard drinks    Types: 1 Standard drinks or equivalent per week  . Drug use: No  . Sexual activity: Yes    Partners: Male    Birth control/protection: Surgical    Comment: tubal ligation  Lifestyle  . Physical activity    Days per week: Not on file    Minutes per session: Not on file  . Stress: Not on file  Relationships  . Social Herbalist on phone: Not on file    Gets together: Not on file    Attends religious service: Not on file    Active member of club or organization: Not on file    Attends meetings of clubs or organizations: Not on file    Relationship status: Not on file  . Intimate partner violence    Fear of current or ex partner: Not on file    Emotionally abused: Not on file    Physically abused: Not on file    Forced sexual activity: Not on file  Other Topics Concern  . Not on file  Social History Narrative  . Not on file    Review of Systems  Constitutional: Negative.   HENT: Negative.   Eyes: Negative.   Respiratory: Negative.   Cardiovascular: Negative.   Gastrointestinal: Negative.   Endocrine: Negative.   Genitourinary: Negative.   Musculoskeletal: Negative.   Skin: Negative.   Allergic/Immunologic: Negative.   Neurological: Negative.   Hematological: Negative.   Psychiatric/Behavioral: Negative.     PHYSICAL EXAMINATION:    BP 126/70 (BP Location: Right Arm, Patient Position: Sitting, Cuff Size: Normal)   Pulse 84   Temp (!) 97.4 F (36.3 C) (Temporal)   Resp 12   Ht 5' 7.5" (1.715 m)   Wt 225 lb (102.1 kg)   LMP 05/01/2018   BMI 34.72 kg/m     General appearance: alert, cooperative and appears stated age   Abdomen: incisions intact.  Minimal hematomas of the left upper and the right incisions.  Abdomen is  soft, non-tender, no masses,  no organomegaly   Pelvic: External genitalia:  no lesions              Urethra:  normal appearing urethra with no masses, tenderness or lesions              Bartholins  and Skenes: normal                 Vagina: normal appearing vagina with normal color and discharge, no lesions              Cervix: absent.  Sutures present.  Minimal yellow blood tinged drainage.                 Bimanual Exam:  Uterus:   absent              Adnexa: no mass, fullness, tenderness                 Chaperone was present for exam.  ASSESSMENT  Status post laparoscopic hysterectomy with vaginal morcellation of uterine specimen, bilateral salpingectomy, cystoscopy.  Healing well.  PLAN  Continue decreased activity.  FU for 6 week post op check.    An After Visit Summary was printed and given to the patient.

## 2018-07-24 NOTE — Progress Notes (Deleted)
GYNECOLOGY  VISIT   HPI: 43 y.o.   Single  African American  female   [id number] with Patient's last menstrual period was 05/01/2018.   here for follow up      GYNECOLOGIC HISTORY: Patient's last menstrual period was 05/01/2018. Contraception:  Hysterectomy Menopausal hormone therapy:  none Last mammogram:  10/26/17 [place] Right BIRADS3:Probably benign Last pap smear:   09/06/13 Neg. HR HPV:neg        OB History    Gravida  3   Para  2   Term  2   Preterm      AB  1   Living        SAB      TAB  1   Ectopic      Multiple      Live Births                 Patient Active Problem List   Diagnosis Date Noted  . Fibroids 07/03/2018  . Status post laparoscopic hysterectomy 07/03/2018  . Class 1 obesity due to excess calories with serious comorbidity and body mass index (BMI) of 33.0 to 33.9 in adult 07/18/2017  . Normocytic anemia due to blood loss 05/10/2017  . Moderate major depression, single episode ([place]) 05/10/2017  . Hypertriglyceridemia without hypercholesterolemia 05/10/2017  . Leiomyoma of uterus, unspecified 09/06/2013  . BP (high blood pressure) 08/09/2013    Past Medical History:  Diagnosis Date  . Abnormal Pap smear of cervix 1994?  Marland Kitchen Anemia   . Anxiety   . Depression   . Fibroid   . Hypertension 2015  . Trichomonas infection 06/2018   Treated with Flagyl 2 gram po 06/28/18.    Past Surgical History:  Procedure Laterality Date  . COLPOSCOPY  1994?  Consuela Mimes N/A 07/03/2018   Procedure: CYSTOSCOPY;  Surgeon: Nunzio Cobbs, MD;  Location: [facility];  Service: Gynecology;  Laterality: N/A;  . TOTAL LAPAROSCOPIC HYSTERECTOMY WITH SALPINGECTOMY Bilateral 07/03/2018   Procedure: TOTAL LAPAROSCOPIC HYSTERECTOMY WITH SALPINGECTOMY;  Surgeon: Nunzio Cobbs, MD;  Location: [facility];  Service: Gynecology;  Laterality: Bilateral;  . TUBAL LIGATION  06/2003    Current Outpatient Medications   Medication Sig Dispense Refill  . docusate sodium (COLACE) 100 MG capsule Take 100 mg by mouth 2 (two) times daily.    Marland Kitchen ibuprofen (ADVIL,MOTRIN) 600 MG tablet Take 1 tablet (600 mg total) by mouth every 6 (six) hours as needed. 30 tablet 0  . losartan-hydrochlorothiazide (HYZAAR) 100-25 MG tablet Take 1 tablet by mouth daily.    . Multiple Vitamin (MULTIVITAMIN) tablet Take 1 tablet by mouth daily.    . ondansetron (ZOFRAN) 4 MG tablet Take 1 tablet (4 mg total) by mouth every 6 (six) hours as needed for nausea. (Patient not taking: Reported on 07/18/2018) 20 tablet 0  . oxyCODONE-acetaminophen (PERCOCET/ROXICET) 5-325 MG tablet Take 1-2 tablets by mouth every 4 (four) hours as needed for moderate pain. (Patient not taking: Reported on 07/18/2018) 30 tablet 0  . varenicline (CHANTIX STARTING MONTH PAK) 0.5 MG X 11 & 1 MG X 42 tablet Take one 0.5 mg tablet by mouth once daily for 3 days, then increase to one 0.5 mg tablet twice daily for 4 days, then increase to one 1 mg tablet twice daily. 53 tablet 0   No current facility-administered medications for this visit.      ALLERGIES: Cephalexin and Lisinopril  Family History  Problem Relation  Age of Onset  . Thyroid disease Mother   . Hypertension Father   . Hypertension Maternal Grandmother   . Diabetes Maternal Grandmother   . Hypertension Paternal Grandmother   . Diabetes Paternal Grandmother   . Breast cancer Paternal Grandmother   . Stroke Paternal Grandmother     Social History   Socioeconomic History  . Marital status: Single    Spouse name: Not on file  . Number of children: Not on file  . Years of education: Not on file  . Highest education level: Not on file  Occupational History  . Not on file  Social Needs  . Financial resource strain: Not on file  . Food insecurity    Worry: Not on file    Inability: Not on file  . Transportation needs    Medical: Not on file    Non-medical: Not on file  Tobacco Use  . Smoking  status: Current Every Day Smoker    Types: Cigars  . Smokeless tobacco: Never Used  . Tobacco comment: pt stopped 06/10/18  Substance and Sexual Activity  . Alcohol use: Yes    Alcohol/week: 1.0 standard drinks    Types: 1 Standard drinks or equivalent per week  . Drug use: No  . Sexual activity: Yes    Partners: Male    Birth control/protection: Surgical    Comment: tubal ligation  Lifestyle  . Physical activity    Days per week: Not on file    Minutes per session: Not on file  . Stress: Not on file  Relationships  . Social Herbalist on phone: Not on file    Gets together: Not on file    Attends religious service: Not on file    Active member of club or organization: Not on file    Attends meetings of clubs or organizations: Not on file    Relationship status: Not on file  . Intimate partner violence    Fear of current or ex partner: Not on file    Emotionally abused: Not on file    Physically abused: Not on file    Forced sexual activity: Not on file  Other Topics Concern  . Not on file  Social History Narrative  . Not on file    Review of Systems  PHYSICAL EXAMINATION:    LMP 05/01/2018     General appearance: alert, cooperative and appears stated age Head: Normocephalic, without obvious abnormality, atraumatic Neck: no adenopathy, supple, symmetrical, trachea midline and thyroid normal to inspection and palpation Lungs: clear to auscultation bilaterally Breasts: normal appearance, no masses or tenderness, No nipple retraction or dimpling, No nipple discharge or bleeding, No axillary or supraclavicular adenopathy Heart: regular rate and rhythm Abdomen: soft, non-tender, no masses,  no organomegaly Extremities: extremities normal, atraumatic, no cyanosis or edema Skin: Skin color, texture, turgor normal. No rashes or lesions Lymph nodes: Cervical, supraclavicular, and axillary nodes normal. No abnormal inguinal nodes palpated Neurologic: Grossly  normal  Pelvic: External genitalia:  no lesions              Urethra:  normal appearing urethra with no masses, tenderness or lesions              Bartholins and Skenes: normal                 Vagina: normal appearing vagina with normal color and discharge, no lesions  Cervix: no lesions                Bimanual Exam:  Uterus:  normal size, contour, position, consistency, mobility, non-tender              Adnexa: no mass, fullness, tenderness              Rectal exam: {yes no:[id number]}.  Confirms.              Anus:  normal sphincter tone, no lesions  Chaperone was present for exam.  ASSESSMENT     PLAN     An After Visit Summary was printed and given to the patient.  ______ minutes face to face time of which over 50% was spent in counseling.

## 2018-08-15 ENCOUNTER — Other Ambulatory Visit: Payer: Self-pay

## 2018-08-15 ENCOUNTER — Encounter: Payer: Self-pay | Admitting: Obstetrics and Gynecology

## 2018-08-15 ENCOUNTER — Ambulatory Visit (INDEPENDENT_AMBULATORY_CARE_PROVIDER_SITE_OTHER): Payer: BC Managed Care – PPO | Admitting: Obstetrics and Gynecology

## 2018-08-15 VITALS — BP 138/88 | HR 74 | Temp 97.3°F | Resp 16 | Ht 67.5 in | Wt 230.5 lb

## 2018-08-15 DIAGNOSIS — Z90710 Acquired absence of both cervix and uterus: Secondary | ICD-10-CM

## 2018-08-15 NOTE — Progress Notes (Signed)
GYNECOLOGY  VISIT   HPI: 43 y.o.   Single  African American  female   [id number] with Patient's last menstrual period was 05/01/2018.   here for   6 week follow up   TOTAL LAPAROSCOPIC HYSTERECTOMY WITH SALPINGECTOMY (Bilateral Uterus) CYSTOSCOPY (N/A Bladder)     States she was due to return to work.      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      No vaginal bleeding.     No pain.     Normal bladder and bowel function.   GYNECOLOGIC HISTORY: Patient's last menstrual period was 05/01/2018. Contraception:  Hysterectomy  Menopausal hormone therapy:  None  Last mammogram:  10-26-17 density C/BIRADS 3 probably benign- 6 month follow up recommended  Last pap smear:   06-22-2018 negative, HR HPV negative         OB History    Gravida  3   Para  2   Term  2   Preterm      AB  1   Living        SAB      TAB  1   Ectopic      Multiple      Live Births                 Patient Active Problem List   Diagnosis Date Noted  . Fibroids 07/03/2018  . Status post laparoscopic hysterectomy 07/03/2018  . Class 1 obesity due to excess calories with serious comorbidity and body mass index (BMI) of 33.0 to 33.9 in adult 07/18/2017  . Normocytic anemia due to blood loss 05/10/2017  . Moderate major depression, single episode ([place]) 05/10/2017  . Hypertriglyceridemia without hypercholesterolemia 05/10/2017  . Leiomyoma of uterus, unspecified 09/06/2013  . BP (high blood pressure) 08/09/2013    Past Medical History:  Diagnosis Date  . Abnormal Pap smear of cervix 1994?  Marland Kitchen Anemia   . Anxiety   . Depression   . Fibroid   . Hypertension 2015  . Trichomonas infection 06/2018   Treated with Flagyl 2 gram po 06/28/18.    Past Surgical History:  Procedure Laterality Date  . COLPOSCOPY  1994?  Consuela Mimes N/A 07/03/2018   Procedure: CYSTOSCOPY;  Surgeon: Nunzio Cobbs, MD;  Location: [facility];  Service: Gynecology;   Laterality: N/A;  . TOTAL LAPAROSCOPIC HYSTERECTOMY WITH SALPINGECTOMY Bilateral 07/03/2018   Procedure: TOTAL LAPAROSCOPIC HYSTERECTOMY WITH SALPINGECTOMY;  Surgeon: Nunzio Cobbs, MD;  Location: [facility];  Service: Gynecology;  Laterality: Bilateral;  . TUBAL LIGATION  06/2003    Current Outpatient Medications  Medication Sig Dispense Refill  . docusate sodium (COLACE) 100 MG capsule Take 100 mg by mouth 2 (two) times daily.    Marland Kitchen ibuprofen (ADVIL,MOTRIN) 600 MG tablet Take 1 tablet (600 mg total) by mouth every 6 (six) hours as needed. (Patient taking differently: Take 200 mg by mouth as needed. ) 30 tablet 0  . losartan-hydrochlorothiazide (HYZAAR) 100-25 MG tablet Take 1 tablet by mouth daily.    . Multiple Vitamin (MULTIVITAMIN) tablet Take 1 tablet by mouth daily.    . varenicline (CHANTIX STARTING MONTH PAK) 0.5 MG X 11 & 1 MG X 42 tablet Take one 0.5 mg tablet by mouth once daily for 3 days, then increase to one 0.5 mg tablet twice daily for 4 days, then increase to one 1 mg tablet twice  daily. 53 tablet 0   No current facility-administered medications for this visit.      ALLERGIES: Cephalexin and Lisinopril  Family History  Problem Relation Age of Onset  . Thyroid disease Mother   . Hypertension Father   . Hypertension Maternal Grandmother   . Diabetes Maternal Grandmother   . Hypertension Paternal Grandmother   . Diabetes Paternal Grandmother   . Breast cancer Paternal Grandmother   . Stroke Paternal Grandmother     Social History   Socioeconomic History  . Marital status: Single    Spouse name: Not on file  . Number of children: Not on file  . Years of education: Not on file  . Highest education level: Not on file  Occupational History  . Not on file  Social Needs  . Financial resource strain: Not on file  . Food insecurity    Worry: Not on file    Inability: Not on file  . Transportation needs    Medical: Not on file     Non-medical: Not on file  Tobacco Use  . Smoking status: Current Every Day Smoker    Types: Cigars  . Smokeless tobacco: Never Used  . Tobacco comment: pt stopped 06/10/18  Substance and Sexual Activity  . Alcohol use: Yes    Alcohol/week: 1.0 standard drinks    Types: 1 Standard drinks or equivalent per week  . Drug use: No  . Sexual activity: Yes    Partners: Male    Birth control/protection: Surgical    Comment: TLH  Lifestyle  . Physical activity    Days per week: Not on file    Minutes per session: Not on file  . Stress: Not on file  Relationships  . Social Herbalist on phone: Not on file    Gets together: Not on file    Attends religious service: Not on file    Active member of club or organization: Not on file    Attends meetings of clubs or organizations: Not on file    Relationship status: Not on file  . Intimate partner violence    Fear of current or ex partner: Not on file    Emotionally abused: Not on file    Physically abused: Not on file    Forced sexual activity: Not on file  Other Topics Concern  . Not on file  Social History Narrative  . Not on file    Review of Systems  Constitutional: Negative.   HENT: Negative.   Eyes: Negative.   Respiratory: Negative.   Cardiovascular: Negative.   Gastrointestinal: Negative.   Endocrine: Negative.   Genitourinary: Negative.   Musculoskeletal: Negative.   Skin: Negative.   Allergic/Immunologic: Negative.   Neurological: Negative.   Hematological: Negative.   Psychiatric/Behavioral: Negative.     PHYSICAL EXAMINATION:    BP 138/88 (BP Location: Right Arm, Patient Position: Sitting, Cuff Size: Normal)   Pulse 74   Temp (!) 97.3 F (36.3 C) (Skin)   Resp 16   Ht 5' 7.5" (1.715 m)   Wt 230 lb 8 oz (104.6 kg)   LMP 05/01/2018   BMI 35.57 kg/m     General appearance: alert, cooperative and appears stated age   Abdomen: incisions intact. No hernias or hematomas.  Abdomen is soft,  non-tender, no masses,  no organomegaly   Pelvic: External genitalia:  no lesions              Urethra:  normal appearing urethra  with no masses, tenderness or lesions              Bartholins and Skenes: normal                 Vagina: normal appearing vagina with normal color and discharge, no lesions              Cervix:  Absent.  Well healed cuff.                 Bimanual Exam:  Uterus:   Absent.               Adnexa: no mass, fullness, tenderness          Chaperone was present for exam.  ASSESSMENT  Status post laparoscopic hysterectomy with bilateral salpingectomy, cystoscopy.  Doing well post op.   PLAN  Ok to return to work without restrictions.  No vaginal intercourse for 2 more weeks.  FU yearly and prn.    An After Visit Summary was printed and given to the patient.

## 2018-09-19 DIAGNOSIS — Z20828 Contact with and (suspected) exposure to other viral communicable diseases: Secondary | ICD-10-CM | POA: Diagnosis not present

## 2018-09-26 DIAGNOSIS — Z20828 Contact with and (suspected) exposure to other viral communicable diseases: Secondary | ICD-10-CM | POA: Diagnosis not present

## 2018-10-02 DIAGNOSIS — Z20828 Contact with and (suspected) exposure to other viral communicable diseases: Secondary | ICD-10-CM | POA: Diagnosis not present

## 2018-10-09 DIAGNOSIS — Z20828 Contact with and (suspected) exposure to other viral communicable diseases: Secondary | ICD-10-CM | POA: Diagnosis not present

## 2018-10-16 DIAGNOSIS — Z20828 Contact with and (suspected) exposure to other viral communicable diseases: Secondary | ICD-10-CM | POA: Diagnosis not present

## 2018-10-23 DIAGNOSIS — Z20828 Contact with and (suspected) exposure to other viral communicable diseases: Secondary | ICD-10-CM | POA: Diagnosis not present

## 2018-12-06 DIAGNOSIS — M546 Pain in thoracic spine: Secondary | ICD-10-CM | POA: Diagnosis not present

## 2019-11-05 IMAGING — CT CT ABD-PELV W/ CM
2 of 5 series · 15 of 46 positions shown, 17 images · IV contrast (ISOVUE)
Comparison: 03/30/2010

CLINICAL DATA: Right lower quadrant abdominal pain, history of
pyelonephritis

EXAM:
CT ABDOMEN AND PELVIS WITH CONTRAST
TECHNIQUE: Multidetector CT imaging of the abdomen and pelvis was performed
using the standard protocol following bolus administration of
intravenous contrast.
CONTRAST:  100mL QUE62I-2JJ IOPAMIDOL (QUE62I-2JJ) INJECTION 61%

[Series 2: axial st · axial · 0.68mm/px · z∈[+1085,+1525]mm · 12 of 102 slices shown, 14 images]
[im 7/102  soft-tissue]
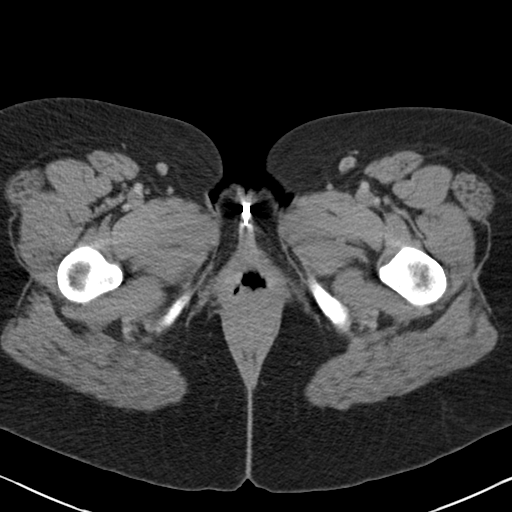
[im 7/102  bone]
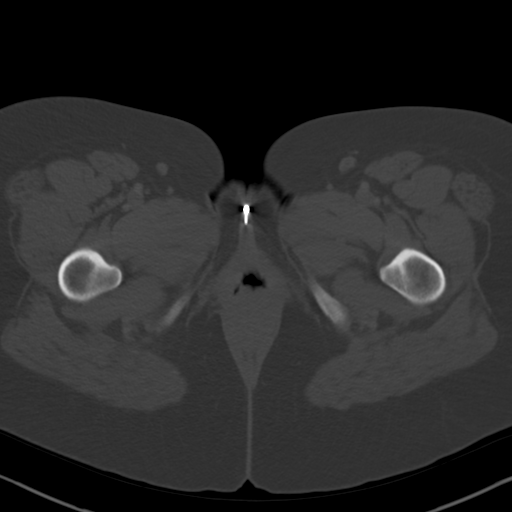
[im 13/102  soft-tissue]
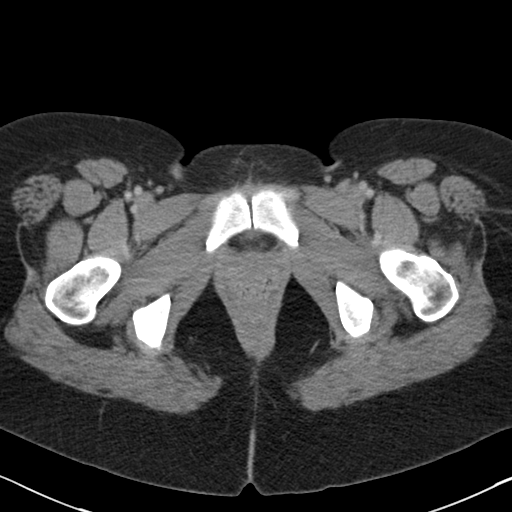
[im 26/102  soft-tissue]
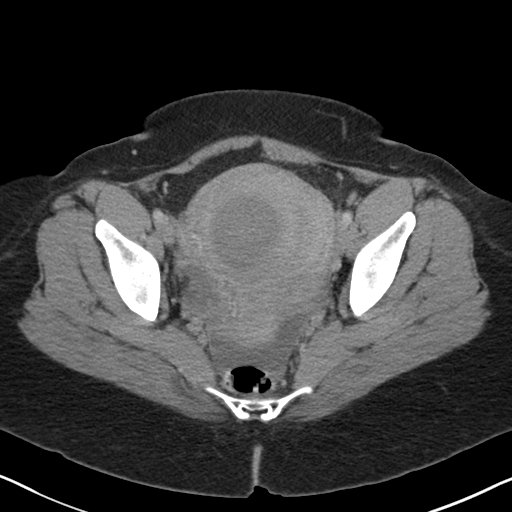
[im 32/102  soft-tissue]
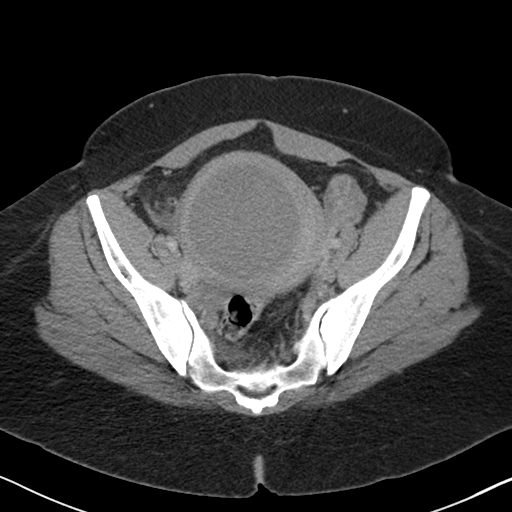
[im 38/102  soft-tissue]
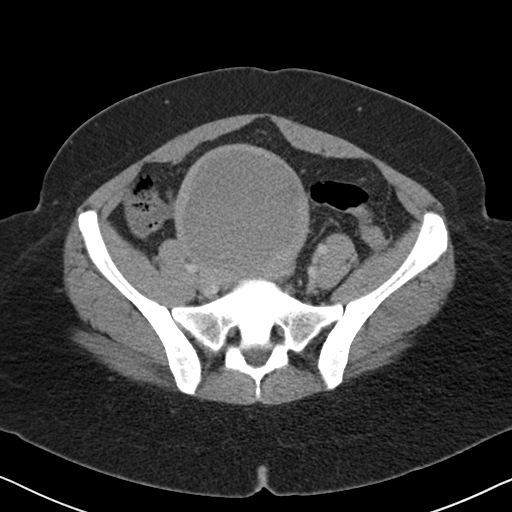
[im 45/102  soft-tissue]
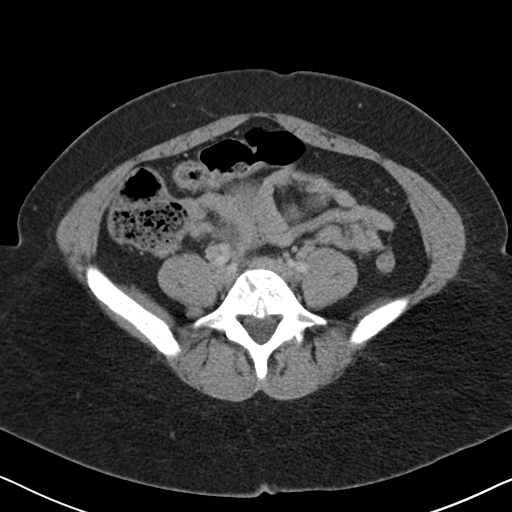
[im 57/102  soft-tissue]
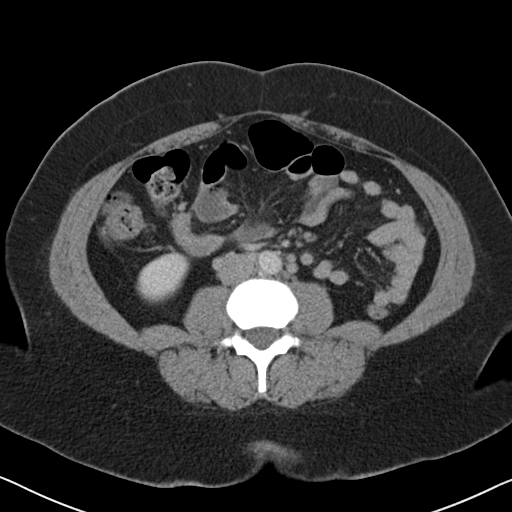
[im 64/102  soft-tissue]
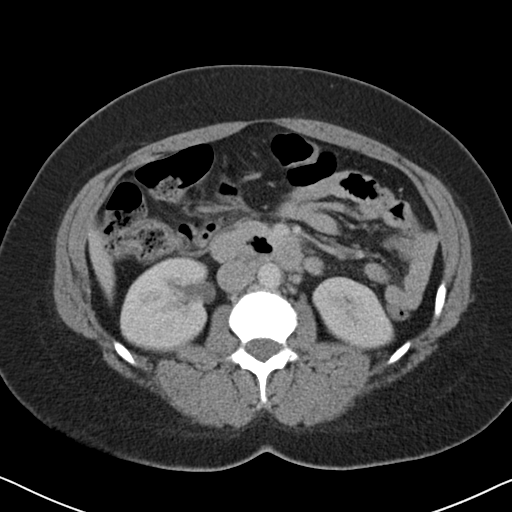
[im 70/102  soft-tissue]
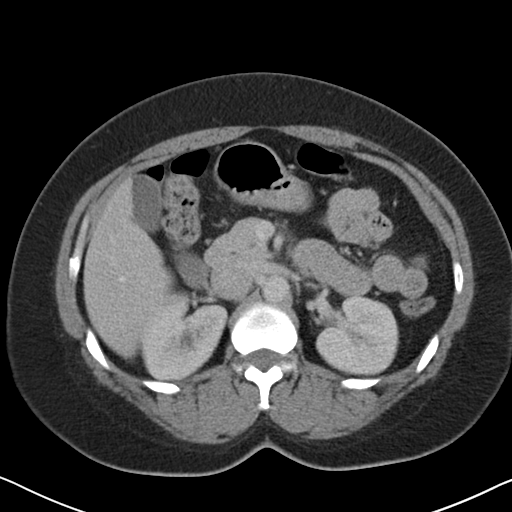
[im 70/102  bone]
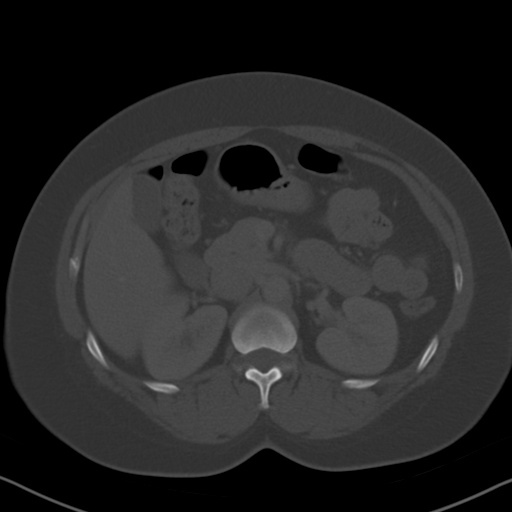
[im 76/102  soft-tissue]
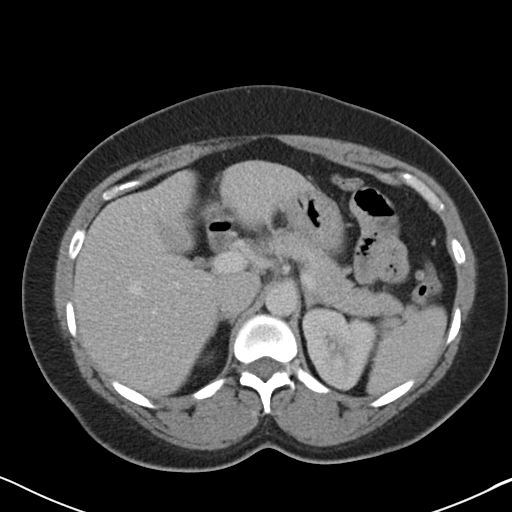
[im 89/102  soft-tissue]
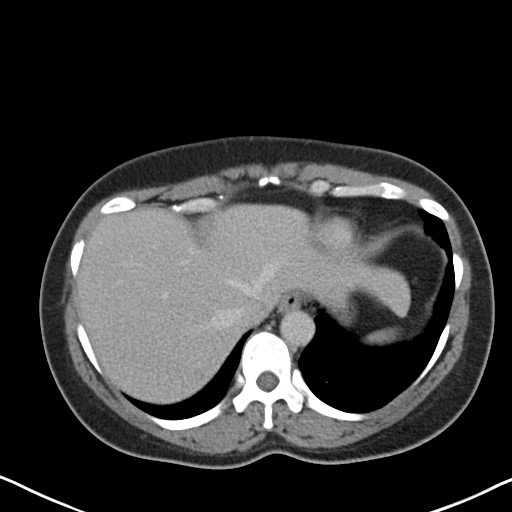
[im 95/102  soft-tissue]
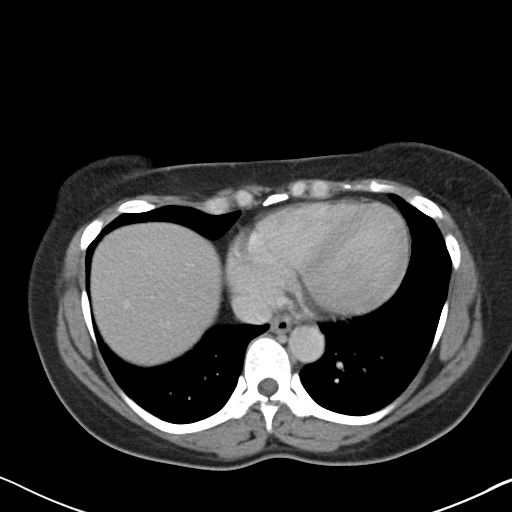

[Series 4: coronal st · coronal · 0.65mm/px · 3 of 82 slices shown]
[im 28/82  soft-tissue]
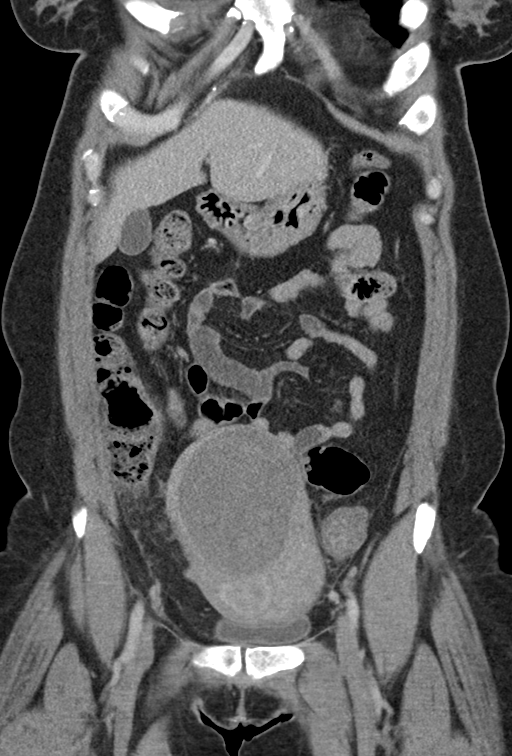
[im 37/82  soft-tissue]
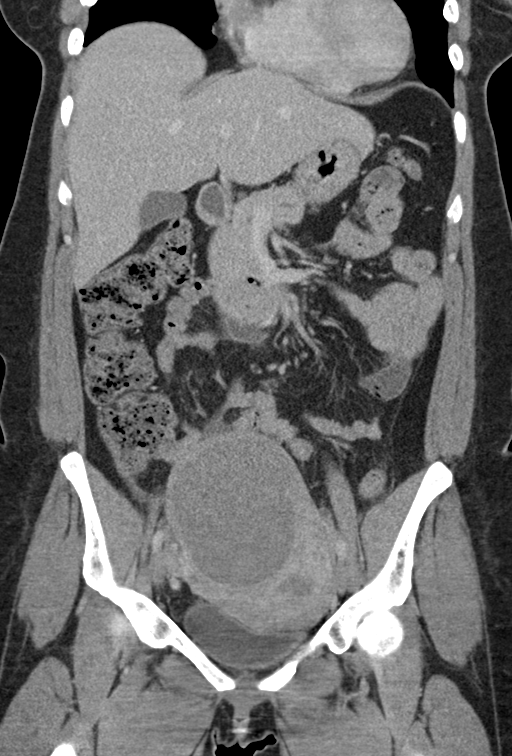
[im 46/82  soft-tissue]
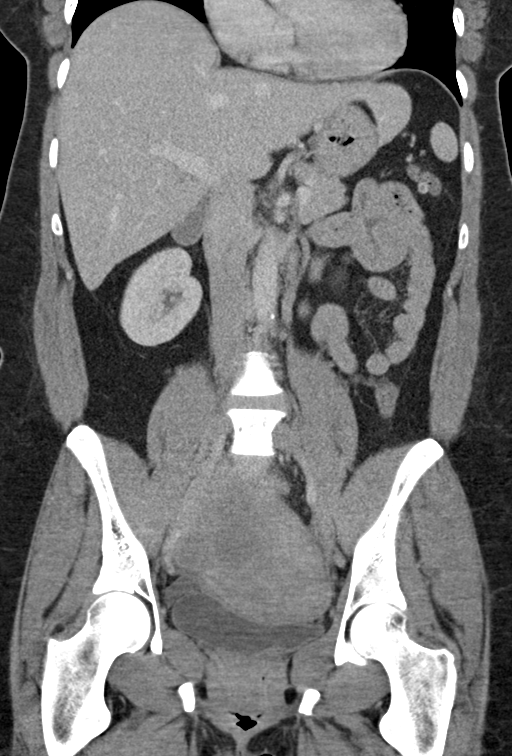

[15 of 46 positions shown; findings below may reference images not displayed]

FINDINGS: Lower chest: Lung bases are clear.

Hepatobiliary: Liver is within normal limits.

Gallbladder is unremarkable. No intrahepatic or extrahepatic ductal
dilatation.

Pancreas: Within normal limits.

Spleen: Within normal limits.

Adrenals/Urinary Tract: Adrenal glands are within normal limits.

Kidneys are within normal limits.  No hydronephrosis.

Bladder is within normal limits.

Stomach/Bowel: Stomach is within normal limits.

No evidence of bowel obstruction.

Normal appendix (series 2/image 70).

Vascular/Lymphatic: No evidence of abdominal aortic aneurysm.

Atherosclerotic calcifications of the abdominal aorta and branch
vessels.

No suspicious abdominopelvic lymphadenopathy.

Reproductive: Uterine fibroids, including a 8.9 x 8.1 x 10.5 cm
necrotic/degenerating subserosal fibroid in the right posterior
uterine fundus (series 2/image 68).

Bilateral ovaries are within normal limits, noting a left corpus
luteal cyst/follicle (series 2/image 33), physiologic.

Other: Small volume pelvic ascites.

Musculoskeletal: Visualized osseous structures are within normal
limits.
IMPRESSION: Uterine fibroids, including a 10.5 cm necrotic/degenerating
subserosal fibroid in the right posterior uterine fundus. This can
be a source of pain.

No evidence of bowel obstruction.  Normal appendix.

No CT findings to suggest pyelonephritis.

Small volume pelvic ascites, likely reactive/physiologic.

## 2021-08-26 DIAGNOSIS — Z720 Tobacco use: Secondary | ICD-10-CM | POA: Insufficient documentation

## 2022-04-15 ENCOUNTER — Ambulatory Visit: Payer: No Typology Code available for payment source | Admitting: Podiatry

## 2022-04-22 ENCOUNTER — Encounter: Payer: Self-pay | Admitting: Podiatry

## 2022-04-22 ENCOUNTER — Ambulatory Visit: Payer: No Typology Code available for payment source | Admitting: Podiatry

## 2022-04-22 DIAGNOSIS — B351 Tinea unguium: Secondary | ICD-10-CM | POA: Diagnosis not present

## 2022-04-22 MED ORDER — TERBINAFINE HCL 250 MG PO TABS: 250.0000 mg | ORAL_TABLET | Freq: Every day | ORAL | 0 refills | Status: AC

## 2022-04-26 ENCOUNTER — Encounter: Payer: Self-pay | Admitting: Podiatry

## 2022-04-26 NOTE — Progress Notes (Signed)
  Subjective:  Patient ID: Kara Morales, female    DOB: [birth year],  MRN: [id number]  Chief Complaint  Patient presents with   Nail Problem    (np) left foot great toe-possible ingrown - thicker than the other toenails, medial border is painful    47 y.o. female presents with the above complaint. History confirmed with patient.  She also notes they are discolored thickened this is getting worse recently  Objective:  Physical Exam: warm, good capillary refill, no trophic changes or ulcerative lesions, normal DP and PT pulses, normal sensory exam, and onychomycosis.  No proximal deep ingrown or paronychia requiring avulsion       Assessment:   1. Onychomycosis      Plan:  Patient was evaluated and treated and all questions answered.  Onychomycosis -Educated on etiology of nail fungus. -Discussed oral topical and laser therapy and risks and benefits of each -eRx for oral terbinafine #90. Educated on risks and benefits of the medication. -Photographs taken -Culture of left hallux nail plate and debris taken  Return in about 4 months (around 08/22/2022) for follow up after nail fungus treatment.

## 2022-08-23 ENCOUNTER — Ambulatory Visit: Payer: No Typology Code available for payment source | Admitting: Podiatry

## 2022-08-23 DIAGNOSIS — B351 Tinea unguium: Secondary | ICD-10-CM | POA: Diagnosis not present

## 2022-08-23 MED ORDER — TERBINAFINE HCL 250 MG PO TABS: 250.0000 mg | ORAL_TABLET | Freq: Every day | ORAL | 0 refills | Status: AC

## 2022-08-24 NOTE — Progress Notes (Signed)
  Subjective:  Patient ID: Kara Morales, female    DOB: [birth year],  MRN: [id number]  Chief Complaint  Patient presents with   Nail Problem    Nail discoloration and thick on the left foot great toe and the great toe on the right is also getting dark and thick. Pain sometimes when she wear certain shoe.    47 y.o. female presents with the above complaint. History confirmed with patient.  She completed the Lamisil had no side effects  Objective:  Physical Exam: warm, good capillary refill, no trophic changes or ulcerative lesions, normal DP and PT pulses, normal sensory exam, and onychomycosis with proximal clearing tendon 20%      Assessment:   1. Onychomycosis      Plan:  Patient was evaluated and treated and all questions answered.  Onychomycosis -Starting to show signs of proximal clearing and improvement.  I recommended continuing Lamisil therapy.  Rx was sent to pharmacy for refill.  Return in 4 months for follow-up.  Return in about 4 months (around 12/23/2022) for follow up after nail fungus treatment.

## 2022-12-23 ENCOUNTER — Ambulatory Visit: Payer: No Typology Code available for payment source | Admitting: Podiatry

## 2023-01-25 ENCOUNTER — Encounter: Payer: Self-pay | Admitting: Podiatry

## 2023-01-25 ENCOUNTER — Ambulatory Visit: Payer: No Typology Code available for payment source | Admitting: Podiatry

## 2023-01-25 VITALS — Ht 67.5 in | Wt 230.5 lb

## 2023-01-25 DIAGNOSIS — B351 Tinea unguium: Secondary | ICD-10-CM

## 2023-01-25 MED ORDER — TERBINAFINE HCL 250 MG PO TABS: 250.0000 mg | ORAL_TABLET | Freq: Every day | ORAL | 0 refills | Status: AC

## 2023-01-27 NOTE — Progress Notes (Signed)
  Subjective:  Patient ID: Kara Morales, female    DOB: [birth year],  MRN: [id number]  Chief Complaint  Patient presents with   Nail Problem    She reports that she is having some digging of the nail in the skin and has a lot of pain.    48 y.o. female presents with the above complaint. History confirmed with patient.  She completed the Lamisil  had no side effects  Objective:  Physical Exam: warm, good capillary refill, no trophic changes or ulcerative lesions, normal DP and PT pulses, normal sensory exam, and onychomycosis with proximal clearing 50% of both nails         Assessment:   1. Onychomycosis      Plan:  Patient was evaluated and treated and all questions answered.  Onychomycosis -Continues to prove about 50% clearance now.  Medial lateral borders of both great toenails were debrided in a slant back fashion to remove the dystrophic areas causing pain.  I recommended continuing Lamisil  therapy.  Rx was sent to pharmacy for refill.  Return in 4 months for follow-up.  Return in about 4 months (around 05/25/2023) for follow up after nail fungus treatment.

## 2023-05-24 ENCOUNTER — Ambulatory Visit: Payer: No Typology Code available for payment source | Admitting: Podiatry

## 2023-05-24 ENCOUNTER — Encounter: Payer: Self-pay | Admitting: Podiatry

## 2023-05-24 VITALS — Ht 67.5 in | Wt 230.0 lb

## 2023-05-24 DIAGNOSIS — B351 Tinea unguium: Secondary | ICD-10-CM | POA: Diagnosis not present

## 2023-05-26 NOTE — Progress Notes (Signed)
  Subjective:  Patient ID: Luiz Sakai, female    DOB: June 19, 1975,  MRN: [id number]  Chief Complaint  Patient presents with   Nail Problem    RM# 5 Follow-up after nail fungus treatment.    48 y.o. female presents with the above complaint. History confirmed with patient.  She completed the Lamisil  had no side effects  Objective:  Physical Exam: warm, good capillary refill, no trophic changes or ulcerative lesions, normal DP and PT pulses, normal sensory exam, and onychomycosis nearly fully resolved now with 75% clearance         Assessment:   1. Onychomycosis      Plan:  Patient was evaluated and treated and all questions answered.  Onychomycosis - Doing well and should heal uneventfully now with continued nail growth, do not think further antifungal therapy is necessary at this point.  Continue to monitor and follow-up with me as needed if further issues develop. No follow-ups on file.
